# Patient Record
Sex: Male | Born: 1955 | Race: White | Hispanic: No | Marital: Single | Smoking: Current some day smoker
Health system: Southern US, Community
[De-identification: ages and names within clinical notes are randomized; demographics above are authoritative.]

## PROBLEM LIST (undated history)

## (undated) DIAGNOSIS — H9190 Unspecified hearing loss, unspecified ear: Secondary | ICD-10-CM

## (undated) DIAGNOSIS — F40231 Fear of injections and transfusions: Secondary | ICD-10-CM

## (undated) DIAGNOSIS — Z973 Presence of spectacles and contact lenses: Secondary | ICD-10-CM

## (undated) DIAGNOSIS — Z8711 Personal history of peptic ulcer disease: Secondary | ICD-10-CM

## (undated) DIAGNOSIS — T7840XA Allergy, unspecified, initial encounter: Secondary | ICD-10-CM

## (undated) DIAGNOSIS — Z974 Presence of external hearing-aid: Secondary | ICD-10-CM

## (undated) DIAGNOSIS — K4021 Bilateral inguinal hernia, without obstruction or gangrene, recurrent: Secondary | ICD-10-CM

## (undated) DIAGNOSIS — Z8719 Personal history of other diseases of the digestive system: Secondary | ICD-10-CM

---
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
## 2018-11-06 ENCOUNTER — Other Ambulatory Visit: Payer: Self-pay

## 2018-11-06 DIAGNOSIS — Z20822 Contact with and (suspected) exposure to covid-19: Secondary | ICD-10-CM

## 2018-11-07 LAB — NOVEL CORONAVIRUS, NAA: SARS-CoV-2, NAA: NOT DETECTED

## 2019-04-05 ENCOUNTER — Other Ambulatory Visit: Payer: Self-pay

## 2019-04-05 ENCOUNTER — Observation Stay (HOSPITAL_COMMUNITY)
Admission: EM | Admit: 2019-04-05 | Discharge: 2019-04-07 | Disposition: A | Discharge status: Home / Self Care | Payer: BC Managed Care – PPO | Attending: Surgery | Admitting: Surgery

## 2019-04-05 ENCOUNTER — Encounter (HOSPITAL_COMMUNITY): Payer: Self-pay | Admitting: Emergency Medicine

## 2019-04-05 ENCOUNTER — Emergency Department (HOSPITAL_COMMUNITY): Payer: BC Managed Care – PPO

## 2019-04-05 DIAGNOSIS — K4030 Unilateral inguinal hernia, with obstruction, without gangrene, not specified as recurrent: Principal | ICD-10-CM | POA: Insufficient documentation

## 2019-04-05 DIAGNOSIS — Z885 Allergy status to narcotic agent status: Secondary | ICD-10-CM | POA: Insufficient documentation

## 2019-04-05 DIAGNOSIS — Z882 Allergy status to sulfonamides status: Secondary | ICD-10-CM | POA: Diagnosis not present

## 2019-04-05 DIAGNOSIS — Z20822 Contact with and (suspected) exposure to covid-19: Secondary | ICD-10-CM | POA: Insufficient documentation

## 2019-04-05 DIAGNOSIS — K460 Unspecified abdominal hernia with obstruction, without gangrene: Secondary | ICD-10-CM | POA: Diagnosis present

## 2019-04-05 DIAGNOSIS — H9190 Unspecified hearing loss, unspecified ear: Secondary | ICD-10-CM | POA: Insufficient documentation

## 2019-04-05 DIAGNOSIS — K4090 Unilateral inguinal hernia, without obstruction or gangrene, not specified as recurrent: Secondary | ICD-10-CM | POA: Insufficient documentation

## 2019-04-05 DIAGNOSIS — Z880 Allergy status to penicillin: Secondary | ICD-10-CM | POA: Insufficient documentation

## 2019-04-05 LAB — LACTIC ACID, PLASMA
Lactic Acid, Venous: 1.4 mmol/L (ref 0.5–1.9)
Lactic Acid, Venous: 1.3 mmol/L (ref 0.5–1.9)

## 2019-04-05 LAB — BASIC METABOLIC PANEL
Anion gap: 10 (ref 5–15)
BUN: 15 mg/dL (ref 8–23)
CO2: 26 mmol/L (ref 22–32)
Calcium: 9.3 mg/dL (ref 8.9–10.3)
Chloride: 105 mmol/L (ref 98–111)
Creatinine, Ser: 0.74 mg/dL (ref 0.61–1.24)
GFR calc Af Amer: >60 mL/min (ref >60)
GFR calc non Af Amer: >60 mL/min (ref >60)
Glucose, Bld: 129 mg/dL — ABNORMAL HIGH (ref 70–99)
Potassium: 4.2 mmol/L (ref 3.5–5.1)
Sodium: 141 mmol/L (ref 135–145)

## 2019-04-05 LAB — RESPIRATORY PANEL BY RT PCR (FLU A&B, COVID)
Influenza A by PCR: NEGATIVE
Influenza B by PCR: NEGATIVE
SARS Coronavirus 2 by RT PCR: NEGATIVE

## 2019-04-05 LAB — CBC WITH DIFFERENTIAL/PLATELET
Abs Immature Granulocytes: 0.04 10*3/uL (ref 0.00–0.07)
Basophils Absolute: 0 10*3/uL (ref 0.0–0.1)
Basophils Relative: 0 %
Eosinophils Absolute: 0 10*3/uL (ref 0.0–0.5)
Eosinophils Relative: 0 %
HCT: 45.4 % (ref 39.0–52.0)
Hemoglobin: 15.2 g/dL (ref 13.0–17.0)
Immature Granulocytes: 0 %
Lymphocytes Relative: 10 %
Lymphs Abs: 1.1 10*3/uL (ref 0.7–4.0)
MCH: 32.1 pg (ref 26.0–34.0)
MCHC: 33.5 g/dL (ref 30.0–36.0)
MCV: 96 fL (ref 80.0–100.0)
Monocytes Absolute: 0.4 10*3/uL (ref 0.1–1.0)
Monocytes Relative: 3 %
Neutro Abs: 10.2 10*3/uL — ABNORMAL HIGH (ref 1.7–7.7)
Neutrophils Relative %: 87 %
Platelets: 301 10*3/uL (ref 150–400)
RBC: 4.73 MIL/uL (ref 4.22–5.81)
RDW: 12.9 % (ref 11.5–15.5)
WBC: 11.8 10*3/uL — ABNORMAL HIGH (ref 4.0–10.5)
nRBC: 0 % (ref 0.0–0.2)

## 2019-04-05 MED ORDER — IOHEXOL 300 MG/ML  SOLN
100.0000 mL | Freq: Once | INTRAMUSCULAR | Status: AC | PRN
  Administered 2019-04-05: 22:00:00 100 mL via INTRAVENOUS

## 2019-04-05 MED ORDER — HYDROMORPHONE HCL 1 MG/ML IJ SOLN
1.0000 mg | Freq: Once | INTRAMUSCULAR | Status: AC
  Administered 2019-04-05: 22:00:00 1 mg via INTRAVENOUS
  Filled 2019-04-05: qty 1

## 2019-04-05 MED ORDER — DIPHENHYDRAMINE HCL 50 MG/ML IJ SOLN: 12.5000 mg | Freq: Four times a day (QID) | INTRAMUSCULAR | Status: DC | PRN

## 2019-04-05 MED ORDER — ACETAMINOPHEN 500 MG PO TABS
1000.0000 mg | ORAL_TABLET | Freq: Four times a day (QID) | ORAL | Status: DC
  Administered 2019-04-05 – 2019-04-06 (×2): 1000 mg via ORAL
  Filled 2019-04-05 (×2): qty 2

## 2019-04-05 MED ORDER — HYDROMORPHONE HCL 1 MG/ML IJ SOLN: 0.5000 mg | INTRAMUSCULAR | Status: DC | PRN

## 2019-04-05 MED ORDER — IBUPROFEN 600 MG PO TABS: 600.0000 mg | ORAL_TABLET | Freq: Four times a day (QID) | ORAL | Status: DC | PRN

## 2019-04-05 MED ORDER — OXYCODONE-ACETAMINOPHEN 5-325 MG PO TABS
2.0000 | ORAL_TABLET | Freq: Once | ORAL | Status: AC
  Administered 2019-04-05: 2 via ORAL
  Filled 2019-04-05: qty 2

## 2019-04-05 MED ORDER — HEPARIN SODIUM (PORCINE) 5000 UNIT/ML IJ SOLN
5000.0000 [IU] | Freq: Three times a day (TID) | INTRAMUSCULAR | Status: DC
  Filled 2019-04-05 (×2): qty 1

## 2019-04-05 MED ORDER — ONDANSETRON 4 MG PO TBDP: 4.0000 mg | ORAL_TABLET | Freq: Four times a day (QID) | ORAL | Status: DC | PRN

## 2019-04-05 MED ORDER — SIMETHICONE 80 MG PO CHEW: 40.0000 mg | CHEWABLE_TABLET | Freq: Four times a day (QID) | ORAL | Status: DC | PRN

## 2019-04-05 MED ORDER — DIPHENHYDRAMINE HCL 12.5 MG/5ML PO ELIX: 12.5000 mg | ORAL_SOLUTION | Freq: Four times a day (QID) | ORAL | Status: DC | PRN

## 2019-04-05 MED ORDER — HYDROMORPHONE HCL 1 MG/ML IJ SOLN
1.0000 mg | Freq: Once | INTRAMUSCULAR | Status: AC
  Administered 2019-04-05: 23:00:00 1 mg via INTRAVENOUS
  Filled 2019-04-05: qty 1

## 2019-04-05 MED ORDER — LACTATED RINGERS IV SOLN: INTRAVENOUS | Status: DC

## 2019-04-05 MED ORDER — OXYCODONE HCL 5 MG PO TABS: 5.0000 mg | ORAL_TABLET | Freq: Four times a day (QID) | ORAL | Status: DC | PRN

## 2019-04-05 MED ORDER — ONDANSETRON HCL 4 MG/2ML IJ SOLN: 4.0000 mg | Freq: Four times a day (QID) | INTRAMUSCULAR | Status: DC | PRN

## 2019-04-05 MED ORDER — SODIUM CHLORIDE 0.9 % IV BOLUS
1000.0000 mL | Freq: Once | INTRAVENOUS | Status: AC
  Administered 2019-04-05: 23:00:00 1000 mL via INTRAVENOUS

## 2019-04-05 NOTE — ED Triage Notes (Signed)
BIB EMS from home. Pt presents with L inguinal hernia. States it "popped out" PTA. Pt appears to be in discomfort in triage.

## 2019-04-05 NOTE — H&P (Addendum)
CC: Left groin pain  Requesting provider: Dr. Renaye Rakers  HPI: Aum Teer is an 64 y.o. male whom denies any prior medical hx presents to ED with left groin pain and bulge.  Pain is described as sharp and nonradiating.  Nothing seems to make it better or worse.  He denies any associated nausea/vomiting/abdominal pain.  He reports an intermittent bulge in his right groin that has been present for years but has never noticed any bulge in the left groin until today.  He denies any history of abdominal surgery or hernia surgery. He reports that since the hernia came out, he has been unable to walk.  He has a significant fear of needles and it took a couple of hours, hydrocodone, and security guards holding him down (at his request) to obtain IV access.  History reviewed. No pertinent past medical history.  History reviewed. No pertinent surgical history.  No family history on file.  Social:  does not have a smoking history on file. He has never used smokeless tobacco. He reports previous alcohol use. He reports previous drug use.  Allergies:  Allergies  Allergen Reactions  . Codeine   . Penicillins Rash  . Sulfa Antibiotics Rash    Medications: I have reviewed the patient's current medications.  Results for orders placed or performed during the hospital encounter of 04/05/19 (from the past 48 hour(s))  Basic metabolic panel     Status: Abnormal   Collection Time: 04/05/19  7:27 PM  Result Value Ref Range   Sodium 141 135 - 145 mmol/L   Potassium 4.2 3.5 - 5.1 mmol/L   Chloride 105 98 - 111 mmol/L   CO2 26 22 - 32 mmol/L   Glucose, Bld 129 (H) 70 - 99 mg/dL    Comment: Glucose reference range applies only to samples taken after fasting for at least 8 hours.   BUN 15 8 - 23 mg/dL   Creatinine, Ser 9.24 0.61 - 1.24 mg/dL   Calcium 9.3 8.9 - 26.8 mg/dL   GFR calc non Af Amer >60 >60 mL/min   GFR calc Af Amer >60 >60 mL/min   Anion gap 10 5 - 15    Comment: Performed at [facility], [address], [place], [place] [address]  CBC with Differential     Status: Abnormal   Collection Time: 04/05/19  7:27 PM  Result Value Ref Range   WBC 11.8 (H) 4.0 - 10.5 K/uL   RBC 4.73 4.22 - 5.81 MIL/uL   Hemoglobin 15.2 13.0 - 17.0 g/dL   HCT 22.2 97.9 - 89.2 %   MCV 96.0 80.0 - 100.0 fL   MCH 32.1 26.0 - 34.0 pg   MCHC 33.5 30.0 - 36.0 g/dL   RDW 11.9 41.7 - 40.8 %   Platelets 301 150 - 400 K/uL   nRBC 0.0 0.0 - 0.2 %   Neutrophils Relative % 87 %   Neutro Abs 10.2 (H) 1.7 - 7.7 K/uL   Lymphocytes Relative 10 %   Lymphs Abs 1.1 0.7 - 4.0 K/uL   Monocytes Relative 3 %   Monocytes Absolute 0.4 0.1 - 1.0 K/uL   Eosinophils Relative 0 %   Eosinophils Absolute 0.0 0.0 - 0.5 K/uL   Basophils Relative 0 %   Basophils Absolute 0.0 0.0 - 0.1 K/uL   Immature Granulocytes 0 %   Abs Immature Granulocytes 0.04 0.00 - 0.07 K/uL    Comment: Performed at [facility], [address] [place], [place] [address]  Lactic acid, plasma     Status: None   Collection Time: 04/05/19  7:45 PM  Result Value Ref Range   Lactic Acid, Venous 1.4 0.5 - 1.9 mmol/L    Comment: Performed at [facility], [address] [place], [place] [address]  Respiratory Panel by RT PCR (Flu A&B, Covid) - Nasopharyngeal Swab     Status: None   Collection Time: 04/05/19  8:10 PM   Specimen: Nasopharyngeal Swab  Result Value Ref Range   SARS Coronavirus 2 by RT PCR NEGATIVE NEGATIVE    Comment: (NOTE) SARS-CoV-2 target nucleic acids are NOT DETECTED. The SARS-CoV-2 RNA is generally detectable in upper respiratoy specimens during the acute phase of infection. The lowest concentration of SARS-CoV-2 viral copies this assay can detect is 131 copies/mL. A negative result does not preclude SARS-Cov-2 infection and should not be used as the sole basis for treatment or other patient management decisions. A negative result may occur with  improper specimen collection/handling, submission of  specimen other than nasopharyngeal swab, presence of viral mutation(s) within the areas targeted by this assay, and inadequate number of viral copies (<131 copies/mL). A negative result must be combined with clinical observations, patient history, and epidemiological information. The expected result is Negative. Fact Sheet for Patients:  https://www.moore.com/https://www.fda.gov/media/142436/download Fact Sheet for Healthcare Providers:  https://www.young.biz/https://www.fda.gov/media/142435/download This test is not yet ap proved or cleared by the [place] FDA and  has been authorized for detection and/or diagnosis of SARS-CoV-2 by FDA under an Emergency Use Authorization (EUA). This EUA will remain  in effect (meaning this test can be used) for the duration of the COVID-19 declaration under Section 564(b)(1) of the Act, 21 U.S.C. section 360bbb-3(b)(1), unless the authorization is terminated or revoked sooner.    Influenza A by PCR NEGATIVE NEGATIVE   Influenza B by PCR NEGATIVE NEGATIVE    Comment: (NOTE) The Xpert Xpress SARS-CoV-2/FLU/RSV assay is intended as an aid in  the diagnosis of influenza from Nasopharyngeal swab specimens and  should not be used as a sole basis for treatment. Nasal washings and  aspirates are unacceptable for Xpert Xpress SARS-CoV-2/FLU/RSV  testing. Fact Sheet for Patients: https://www.moore.com/https://www.fda.gov/media/142436/download Fact Sheet for Healthcare Providers: https://www.young.biz/https://www.fda.gov/media/142435/download This test is not yet approved or cleared by the [place] FDA and  has been authorized for detection and/or diagnosis of SARS-CoV-2 by  FDA under an Emergency Use Authorization (EUA). This EUA will remain  in effect (meaning this test can be used) for the duration of the  Covid-19 declaration under Section 564(b)(1) of the Act, 21  U.S.C. section 360bbb-3(b)(1), unless the authorization is  terminated or revoked. Performed at [facility], [address] [place], [place] [address]    Lactic acid, plasma     Status: None   Collection Time: 04/05/19  9:27 PM  Result Value Ref Range   Lactic Acid, Venous 1.3 0.5 - 1.9 mmol/L    Comment: Performed at [facility], [address] [place], [place] [address]    CT ABDOMEN PELVIS W CONTRAST  Result Date: 04/05/2019 CLINICAL DATA:  Left inguinal hernia concern for incarceration EXAM: CT ABDOMEN AND PELVIS WITH CONTRAST TECHNIQUE: Multidetector CT imaging of the abdomen and pelvis was performed using the standard protocol following bolus administration of intravenous contrast. CONTRAST:  100mL OMNIPAQUE IOHEXOL 300 MG/ML  SOLN COMPARISON:  None. FINDINGS: Lower chest: Abdomen: Lung bases are clear. Hepatobiliary: No focal liver abnormality is seen. No gallstones,  gallbladder wall thickening, or biliary dilatation. Pancreas: Unremarkable. No pancreatic ductal dilatation or surrounding inflammatory changes. Spleen: Normal in size without focal abnormality. Adrenals/Urinary Tract: Adrenal glands are unremarkable. Kidneys are normal, without renal calculi, focal lesion, or hydronephrosis. Bladder is unremarkable. Stomach/Bowel: There is no evidence of bowel obstruction. There is segmental wall thickening involving the mid jejunum, in the left mid abdomen and left lower quadrant. Findings are consistent with enteritis. Normal gas-filled appendix right lower quadrant. Vascular/Lymphatic: Mild atherosclerosis of the aorta. No pathologic adenopathy. Reproductive: Prostate is mildly enlarged but otherwise unremarkable. Other: Left inguinal hernia is identified. I do not see any bowel herniation. A small amount of fluid is seen within the hernia sac. There is stranding of the herniated fat, compatible with incarceration. There is trace ascites throughout the abdomen, likely reactive. No free intra-abdominal gas. Musculoskeletal: No acute or destructive bony lesions. Reconstructed images demonstrate no additional findings. IMPRESSION: 1. Likely  incarcerated fat containing left inguinal hernia. A small amount of ascites protrudes into the hernia as well. No bowel herniation. 2. Segmental wall thickening of the mid jejunum consistent with inflammatory or infectious enteritis. 3. Trace ascites. Electronically Signed   By: Sharlet Salina M.D.   On: 04/05/2019 22:02    ROS - all of the below systems have been reviewed with the patient and positives are indicated with bold text General: chills, fever or night sweats Eyes: blurry vision or double vision ENT: epistaxis or sore throat Allergy/Immunology: itchy/watery eyes or nasal congestion Hematologic/Lymphatic: bleeding problems, blood clots or swollen lymph nodes Endocrine: temperature intolerance or unexpected weight changes Breast: new or changing breast lumps or nipple discharge Resp: cough, shortness of breath, or wheezing CV: chest pain or dyspnea on exertion GI: as per HPI GU: dysuria, trouble voiding, or hematuria MSK: joint pain or joint stiffness Neuro: TIA or stroke symptoms Derm: pruritus and skin lesion changes Psych: anxiety and depression  PE Blood pressure 130/87, pulse 79, temperature 98.4 F (36.9 C), temperature source Oral, resp. rate 20, height 5\' 8"  (1.727 m), weight 49.9 kg, SpO2 95 %. Constitutional: NAD; conversant; no deformities; wearing mask Eyes: Moist conjunctiva; no lid lag; anicteric; PERRL Neck: Trachea midline; no thyromegaly Lungs: Normal respiratory effort; no tactile fremitus CV: RRR; no palpable thrills; no pitting edema GI: Abd soft, nontender, nondistended; no palpable hepatosplenomegaly; left groin with incarcerated bulge; no overlying skin changes MSK: Normal range of motion of extremities; no clubbing/cyanosis Psychiatric: Appropriate affect; alert and oriented x3 Lymphatic: No palpable cervical or axillary lymphadenopathy  Results for orders placed or performed during the hospital encounter of 04/05/19 (from the past 48 hour(s))  Basic  metabolic panel     Status: Abnormal   Collection Time: 04/05/19  7:27 PM  Result Value Ref Range   Sodium 141 135 - 145 mmol/L   Potassium 4.2 3.5 - 5.1 mmol/L   Chloride 105 98 - 111 mmol/L   CO2 26 22 - 32 mmol/L   Glucose, Bld 129 (H) 70 - 99 mg/dL    Comment: Glucose reference range applies only to samples taken after fasting for at least 8 hours.   BUN 15 8 - 23 mg/dL   Creatinine, Ser 04/07/19 0.61 - 1.24 mg/dL   Calcium 9.3 8.9 - 2.13 mg/dL   GFR calc non Af Amer >60 >60 mL/min   GFR calc Af Amer >60 >60 mL/min   Anion gap 10 5 - 15    Comment: Performed at [facility], [address],  [place], [place] [address]  CBC with Differential     Status: Abnormal   Collection Time: 04/05/19  7:27 PM  Result Value Ref Range   WBC 11.8 (H) 4.0 - 10.5 K/uL   RBC 4.73 4.22 - 5.81 MIL/uL   Hemoglobin 15.2 13.0 - 17.0 g/dL   HCT 01.6 01.0 - 93.2 %   MCV 96.0 80.0 - 100.0 fL   MCH 32.1 26.0 - 34.0 pg   MCHC 33.5 30.0 - 36.0 g/dL   RDW 35.5 73.2 - 20.2 %   Platelets 301 150 - 400 K/uL   nRBC 0.0 0.0 - 0.2 %   Neutrophils Relative % 87 %   Neutro Abs 10.2 (H) 1.7 - 7.7 K/uL   Lymphocytes Relative 10 %   Lymphs Abs 1.1 0.7 - 4.0 K/uL   Monocytes Relative 3 %   Monocytes Absolute 0.4 0.1 - 1.0 K/uL   Eosinophils Relative 0 %   Eosinophils Absolute 0.0 0.0 - 0.5 K/uL   Basophils Relative 0 %   Basophils Absolute 0.0 0.0 - 0.1 K/uL   Immature Granulocytes 0 %   Abs Immature Granulocytes 0.04 0.00 - 0.07 K/uL    Comment: Performed at [facility], [address] [place], [place] [address]  Lactic acid, plasma     Status: None   Collection Time: 04/05/19  7:45 PM  Result Value Ref Range   Lactic Acid, Venous 1.4 0.5 - 1.9 mmol/L    Comment: Performed at [facility], [address], [place], [place] [address]  Respiratory Panel by RT PCR (Flu A&B, Covid) - Nasopharyngeal Swab     Status: None   Collection Time: 04/05/19  8:10 PM   Specimen: Nasopharyngeal Swab   Result Value Ref Range   SARS Coronavirus 2 by RT PCR NEGATIVE NEGATIVE    Comment: (NOTE) SARS-CoV-2 target nucleic acids are NOT DETECTED. The SARS-CoV-2 RNA is generally detectable in upper respiratoy specimens during the acute phase of infection. The lowest concentration of SARS-CoV-2 viral copies this assay can detect is 131 copies/mL. A negative result does not preclude SARS-Cov-2 infection and should not be used as the sole basis for treatment or other patient management decisions. A negative result may occur with  improper specimen collection/handling, submission of specimen other than nasopharyngeal swab, presence of viral mutation(s) within the areas targeted by this assay, and inadequate number of viral copies (<131 copies/mL). A negative result must be combined with clinical observations, patient history, and epidemiological information. The expected result is Negative. Fact Sheet for Patients:  https://www.moore.com/ Fact Sheet for Healthcare Providers:  https://www.young.biz/ This test is not yet ap proved or cleared by the [place] FDA and  has been authorized for detection and/or diagnosis of SARS-CoV-2 by FDA under an Emergency Use Authorization (EUA). This EUA will remain  in effect (meaning this test can be used) for the duration of the COVID-19 declaration under Section 564(b)(1) of the Act, 21 U.S.C. section 360bbb-3(b)(1), unless the authorization is terminated or revoked sooner.    Influenza A by PCR NEGATIVE NEGATIVE   Influenza B by PCR NEGATIVE NEGATIVE    Comment: (NOTE) The Xpert Xpress SARS-CoV-2/FLU/RSV assay is intended as an aid in  the diagnosis of influenza from Nasopharyngeal swab specimens and  should not be used as a sole basis for treatment. Nasal washings and  aspirates are unacceptable for Xpert Xpress SARS-CoV-2/FLU/RSV  testing. Fact Sheet for  Patients: https://www.moore.com/ Fact Sheet for Healthcare Providers: https://www.young.biz/ This test  is not yet approved or cleared by the [place] and  has been authorized for detection and/or diagnosis of SARS-CoV-2 by  FDA under an Emergency Use Authorization (EUA). This EUA will remain  in effect (meaning this test can be used) for the duration of the  Covid-19 declaration under Section 564(b)(1) of the Act, 21  U.S.C. section 360bbb-3(b)(1), unless the authorization is  terminated or revoked. Performed at [facility], [place] [address], [place], [place] [address]   Lactic acid, plasma     Status: None   Collection Time: 04/05/19  9:27 PM  Result Value Ref Range   Lactic Acid, Venous 1.3 0.5 - 1.9 mmol/L    Comment: Performed at [place] [address], [place], [place] [address]    CT ABDOMEN PELVIS W CONTRAST  Result Date: 04/05/2019 CLINICAL DATA:  Left inguinal hernia concern for incarceration EXAM: CT ABDOMEN AND PELVIS WITH CONTRAST TECHNIQUE: Multidetector CT imaging of the abdomen and pelvis was performed using the standard protocol following bolus administration of intravenous contrast. CONTRAST:  127mL OMNIPAQUE IOHEXOL 300 MG/ML  SOLN COMPARISON:  None. FINDINGS: Lower chest: Abdomen: Lung bases are clear. Hepatobiliary: No focal liver abnormality is seen. No gallstones, gallbladder wall thickening, or biliary dilatation. Pancreas: Unremarkable. No pancreatic ductal dilatation or surrounding inflammatory changes. Spleen: Normal in size without focal abnormality. Adrenals/Urinary Tract: Adrenal glands are unremarkable. Kidneys are normal, without renal calculi, focal lesion, or hydronephrosis. Bladder is unremarkable. Stomach/Bowel: There is no evidence of bowel obstruction. There is segmental wall thickening involving the mid jejunum, in the left mid abdomen and left lower quadrant. Findings are consistent with  enteritis. Normal gas-filled appendix right lower quadrant. Vascular/Lymphatic: Mild atherosclerosis of the aorta. No pathologic adenopathy. Reproductive: Prostate is mildly enlarged but otherwise unremarkable. Other: Left inguinal hernia is identified. I do not see any bowel herniation. A small amount of fluid is seen within the hernia sac. There is stranding of the herniated fat, compatible with incarceration. There is trace ascites throughout the abdomen, likely reactive. No free intra-abdominal gas. Musculoskeletal: No acute or destructive bony lesions. Reconstructed images demonstrate no additional findings. IMPRESSION: 1. Likely incarcerated fat containing left inguinal hernia. A small amount of ascites protrudes into the hernia as well. No bowel herniation. 2. Segmental wall thickening of the mid jejunum consistent with inflammatory or infectious enteritis. 3. Trace ascites. Electronically Signed   By: Randa Ngo M.D.   On: 04/05/2019 22:02    A/P: Ava Deguire is an 64 y.o. male with no known medical hx here today with incarcerated fat containing inguinal hernia  -Given his significant symptoms and additionally the degree of difficulty he had to go through to get IV access, will plan to admit to hospital and fix the hernia as OR time allows -CT shows no bowel (which I have personally reviewed) but his pain has interfered with his ability to ambulate -The anatomy and physiology of the GI tract and abdominal wall was discussed at length with the patient with associated illustrations. The pathophysiology of hernias was discussed at length as well -We discussed left inguinal hernia repair with mesh; occasionally repairing alone with stitches if findings dictate this to be necessary -The planned procedure, material risks (including, but not limited to, pain, bleeding, infection, scarring, need for blood transfusion, damage to surrounding structures-blood vessels/nerves/viscus/organs, need for  additional procedures, recurrence, chronic pain, mesh complications including erosion into other structures/vessels/organs/viscus, worsening of pre-existing medical conditions, pneumonia, heart attack, stroke, death)  benefits and alternatives to surgery were discussed at length. I noted a good probability that the procedure help improve his symptoms. The patient's questions were answered to his satisfaction, he voiced understanding and they elected to proceed with surgery. Additionally, we discussed typical postoperative expectations and the recovery process.  Stephanie Coup. Cliffton Asters, M.D. [facility] Use [email or web address] to contact on call provider

## 2019-04-05 NOTE — ED Provider Notes (Signed)
[facility] EMERGENCY DEPARTMENT Provider Note   CSN: [id number] Arrival date & time: 04/05/19  1810     History Chief Complaint  Patient presents with  . Hernia    Clifford Green is a 64 y.o. male presenting with left inguinal pain.  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  Left one popped out while at work today around 1 pm.  Hasn't been able ot reduce.  Severe pain.  Nausea, self-induced vomiting.  Not sure if passing gas.    No fevers. NKDA  HPI     History reviewed. No pertinent past medical history.  Patient Active Problem List   Diagnosis Date Noted  . Incarcerated hernia 04/05/2019    History reviewed. No pertinent surgical history.     No family history on file.  Social History   Tobacco Use  . Smoking status: Not on file  . Smokeless tobacco: Never Used  Substance Use Topics  . Alcohol use: Not Currently  . Drug use: Not Currently    Home Medications Prior to Admission medications   Medication Sig Start Date End Date Taking? Authorizing Provider  cetirizine (ZYRTEC) 10 MG tablet Take 10 mg by mouth at bedtime.   Yes [provider]  omeprazole (PRILOSEC) 20 MG capsule Take 20 mg by mouth every evening.   Yes [provider]    Allergies    Codeine, Penicillins, and Sulfa antibiotics  Review of Systems   Review of Systems  Constitutional: Negative for chills and fever.  Respiratory: Negative for cough and shortness of breath.   Cardiovascular: Negative for chest pain and palpitations.  Gastrointestinal: Positive for nausea and vomiting. Negative for abdominal pain.  All other systems reviewed and are negative.   Physical Exam Updated Vital Signs BP 125/79 (BP Location: Left Arm)   Pulse 66   Temp 98.3 F (36.8 C) (Oral)   Resp 20   Ht 5\' 8"  (1.727 m)   Wt 49.1 kg   SpO2 98%   BMI 16.46 kg/m   Physical Exam Vitals and nursing note reviewed.  Constitutional:      General: He is in acute  distress.     Appearance: He is well-developed.     Comments: Thin habitus  HENT:     Head: Normocephalic and atraumatic.  Eyes:     Conjunctiva/sclera: Conjunctivae normal.  Cardiovascular:     Rate and Rhythm: Normal rate and regular rhythm.     Pulses: Normal pulses.  Pulmonary:     Effort: Pulmonary effort is normal. No respiratory distress.  Abdominal:     Palpations: Abdomen is soft.     Tenderness: There is no abdominal tenderness.  Genitourinary:    Comments: Bilateral indirect inguinal hernias, left > right, right hernia soft and reducible, left hernia firm and tender to touch, unable to initially reduce, some mild overlying erythema without crepitus or skin induration No involvement of the scrotum or testes, no palpable direct hernia, no tenderness of the testicles or epididymis, +cremastric reflux Normal circumsized penis, no lesions or drainage Musculoskeletal:     Cervical back: Neck supple.  Skin:    General: Skin is warm and dry.  Neurological:     Mental Status: He is alert.     ED Results / Procedures / Treatments   Labs (all labs ordered are listed, but only abnormal results are displayed) Labs Reviewed  BASIC METABOLIC PANEL - Abnormal; Notable for the following components:      Result Value  Glucose, Bld 129 (*)    All other components within normal limits  CBC WITH DIFFERENTIAL/PLATELET - Abnormal; Notable for the following components:   WBC 11.8 (*)    Neutro Abs 10.2 (*)    All other components within normal limits  RESPIRATORY PANEL BY RT PCR (FLU A&B, COVID)  SURGICAL PCR SCREEN  LACTIC ACID, PLASMA  LACTIC ACID, PLASMA  HIV ANTIBODY (ROUTINE TESTING W REFLEX)    EKG None  Radiology CT ABDOMEN PELVIS W CONTRAST  Result Date: 04/05/2019 CLINICAL DATA:  Left inguinal hernia concern for incarceration EXAM: CT ABDOMEN AND PELVIS WITH CONTRAST TECHNIQUE: Multidetector CT imaging of the abdomen and pelvis was performed using the standard  protocol following bolus administration of intravenous contrast. CONTRAST:  OMNIPAQUE IOHEXOL 300 MG/ML  SOLN COMPARISON:  None. FINDINGS: Lower chest: Abdomen: Lung bases are clear. Hepatobiliary: No focal liver abnormality is seen. No gallstones, gallbladder wall thickening, or biliary dilatation. Pancreas: Unremarkable. No pancreatic ductal dilatation or surrounding inflammatory changes. Spleen: Normal in size without focal abnormality. Adrenals/Urinary Tract: Adrenal glands are unremarkable. Kidneys are normal, without renal calculi, focal lesion, or hydronephrosis. Bladder is unremarkable. Stomach/Bowel: There is no evidence of bowel obstruction. There is segmental wall thickening involving the mid jejunum, in the left mid abdomen and left lower quadrant. Findings are consistent with enteritis. Normal gas-filled appendix right lower quadrant. Vascular/Lymphatic: Mild atherosclerosis of the aorta. No pathologic adenopathy. Reproductive: Prostate is mildly enlarged but otherwise unremarkable. Other: Left inguinal hernia is identified. I do not see any bowel herniation. A small amount of fluid is seen within the hernia sac. There is stranding of the herniated fat, compatible with incarceration. There is trace ascites throughout the abdomen, likely reactive. No free intra-abdominal gas. Musculoskeletal: No acute or destructive bony lesions. Reconstructed images demonstrate no additional findings. IMPRESSION: 1. Likely incarcerated fat containing left inguinal hernia. A small amount of ascites protrudes into the hernia as well. No bowel herniation. 2. Segmental wall thickening of the mid jejunum consistent with inflammatory or infectious enteritis. 3. Trace ascites. Electronically Signed   By: Sharlet Salina M.D.   On: 04/05/2019 22:02    Procedures Procedures (including critical care time)  Medications Ordered in ED Medications  heparin injection 5,000 Units (5,000 Units Subcutaneous Refused 04/05/19  2323)  lactated ringers infusion ( Intravenous New Bag/Given 04/06/19 0011)  diphenhydrAMINE (BENADRYL) 12.5 MG/5ML elixir 12.5 mg (has no administration in time range)    Or  diphenhydrAMINE (BENADRYL) injection 12.5 mg (has no administration in time range)  ondansetron (ZOFRAN-ODT) disintegrating tablet 4 mg (has no administration in time range)    Or  ondansetron (ZOFRAN) injection 4 mg (has no administration in time range)  simethicone (MYLICON) chewable tablet 40 mg (has no administration in time range)  acetaminophen (TYLENOL) tablet 1,000 mg (1,000 mg Oral Given 04/05/19 2323)  ibuprofen (ADVIL) tablet 600 mg (has no administration in time range)  oxyCODONE (Oxy IR/ROXICODONE) immediate release tablet 5 mg (has no administration in time range)  HYDROmorphone (DILAUDID) injection 0.5 mg (has no administration in time range)  oxyCODONE-acetaminophen (PERCOCET/ROXICET) 5-325 MG per tablet 2 tablet (2 tablets Oral Given 04/05/19 1915)  HYDROmorphone (DILAUDID) injection 1 mg (1 mg Intravenous Given 04/05/19 2153)  iohexol (OMNIPAQUE) 300 MG/ML solution 100 mL (100 mLs Intravenous Contrast Given 04/05/19 2149)  HYDROmorphone (DILAUDID) injection 1 mg (1 mg Intravenous Given 04/05/19 2323)  sodium chloride 0.9 % bolus 1,000 mL (1,000 mLs Intravenous Bolus from Bag 04/05/19 2324)    ED  Course  I have reviewed the triage vital signs and the nursing notes.  Pertinent labs & imaging results that were available during my care of the patient were reviewed by me and considered in my medical decision making (see chart for details).  64 yo male presenting with left sided indirect inguinal hernia, bulged out around 1 pm, here with nausea and vomiting as well.  Initial exam was concerning for incarceration vs. Strangulation.  It was difficult to obtain IV access to get his pain under control, as noted in the ED course below.  However, with IV pain medications, he was able to tolerate his CT scan and  subsequent attempt by myself for bedside reduction.  In trendelenburg, I was able to partially reduce the left hernia, but cannot get it all back in.  He continues having pain, particularly when standing up.  There was no strangulation on CT imaging and his lactate was wnl.  We opted for admission to the general surgery service for pain control.  He reports no other medical conditions and does not take any medications at baseline, "only vitamins."  He has allergies to penicillins and sulfa.  Clinical Course as of Apr 05 144  Thu Apr 05, 2019  1906 Attempted bedside reduction, unable to do so.  Largely limited by patient pain, but hernia remains tense, firm, tender.  Patient refusing IV for pain medicine or labs, stating "I have needles, I get violent when you come near me with a needle, don't fucking do it."  I explained my concern for incarceration as a time-sensitive issue, and a need for rapid reduction.  He still refuses.  We'll try PO percocet and ice packs, and I'll reach out to general surgery as it has already been 6 hours since the hernia came out.   [MT]  1926 I spoke to the surgeon who states we need bloodwork and CT scan.  I tried again to speak to the patient, he is willing now to attempt an IV.  I advised his nurse to bring extra staffing to help restrain the patient while placing the IV, and she said she would do so     [MT]  2132 Lactic Acid, Venous: 1.4 [MT]  2132 Normal lactate reassuring in terms of tissue ischemia, awaiting on CT scan, contacted CT tech to ask for expedited scan   [MT]  2212 IMPRESSION: 1. Likely incarcerated fat containing left inguinal hernia. A small amount of ascites protrudes into the hernia as well. No bowel herniation. 2. Segmental wall thickening of the mid jejunum consistent with inflammatory or infectious enteritis. 3. Trace ascites.   [MT]  2300 Pain improved and I'm able to partially but not fully reduce his inguinal hernia.  It's softer now,  but still tender, and he's still nauseated.  I doubt strangulation, but am concerned this may remain incarcerated, patient admitted by Dr Thayer Ohm white to general surgery service.   [MT]    Clinical Course User Index [MT] Xzavier Swinger, Kermit Balo, MD   Final Clinical Impression(s) / ED Diagnoses Final diagnoses:  Left inguinal hernia    Rx / DC Orders ED Discharge Orders    None       Terald Sleeper, MD 04/06/19 [phone]

## 2019-04-05 NOTE — ED Notes (Signed)
Patient transported to CT 

## 2019-04-06 ENCOUNTER — Observation Stay (HOSPITAL_COMMUNITY): Payer: BC Managed Care – PPO | Admitting: Anesthesiology

## 2019-04-06 ENCOUNTER — Encounter (HOSPITAL_COMMUNITY): Payer: Self-pay

## 2019-04-06 ENCOUNTER — Encounter (HOSPITAL_COMMUNITY): Admission: EM | Disposition: A | Discharge status: Home / Self Care | Payer: Self-pay | Source: Home / Self Care

## 2019-04-06 HISTORY — PX: INGUINAL HERNIA REPAIR: SHX194

## 2019-04-06 LAB — SURGICAL PCR SCREEN
MRSA, PCR: NEGATIVE
Staphylococcus aureus: POSITIVE — AB

## 2019-04-06 LAB — HIV ANTIBODY (ROUTINE TESTING W REFLEX): HIV Screen 4th Generation wRfx: NONREACTIVE

## 2019-04-06 SURGERY — REPAIR, HERNIA, INGUINAL, INCARCERATED
Anesthesia: General | Site: Groin | Laterality: Left

## 2019-04-06 MED ORDER — DIPHENHYDRAMINE HCL 50 MG/ML IJ SOLN: 12.5000 mg | Freq: Four times a day (QID) | INTRAMUSCULAR | Status: DC | PRN

## 2019-04-06 MED ORDER — ENOXAPARIN SODIUM 40 MG/0.4ML ~~LOC~~ SOLN: 40.0000 mg | SUBCUTANEOUS | Status: DC

## 2019-04-06 MED ORDER — MORPHINE SULFATE (PF) 2 MG/ML IV SOLN: 1.0000 mg | INTRAVENOUS | Status: DC | PRN

## 2019-04-06 MED ORDER — DEXAMETHASONE SODIUM PHOSPHATE 10 MG/ML IJ SOLN
INTRAMUSCULAR | Status: DC | PRN
  Administered 2019-04-06: 4 mg via INTRAVENOUS

## 2019-04-06 MED ORDER — MIDAZOLAM HCL 2 MG/2ML IJ SOLN
INTRAMUSCULAR | Status: AC
  Filled 2019-04-06: qty 2

## 2019-04-06 MED ORDER — DIPHENHYDRAMINE HCL 12.5 MG/5ML PO ELIX: 12.5000 mg | ORAL_SOLUTION | Freq: Four times a day (QID) | ORAL | Status: DC | PRN

## 2019-04-06 MED ORDER — OXYCODONE HCL 5 MG PO TABS
5.0000 mg | ORAL_TABLET | ORAL | Status: DC | PRN
  Administered 2019-04-07: 10 mg via ORAL
  Filled 2019-04-06: qty 2

## 2019-04-06 MED ORDER — DEXAMETHASONE SODIUM PHOSPHATE 10 MG/ML IJ SOLN
INTRAMUSCULAR | Status: AC
  Filled 2019-04-06: qty 1

## 2019-04-06 MED ORDER — SUCCINYLCHOLINE CHLORIDE 200 MG/10ML IV SOSY
PREFILLED_SYRINGE | INTRAVENOUS | Status: AC
  Filled 2019-04-06: qty 10

## 2019-04-06 MED ORDER — BUPIVACAINE HCL (PF) 0.25 % IJ SOLN
INTRAMUSCULAR | Status: AC
  Filled 2019-04-06: qty 30

## 2019-04-06 MED ORDER — MIDAZOLAM HCL 5 MG/5ML IJ SOLN
INTRAMUSCULAR | Status: DC | PRN
  Administered 2019-04-06: 2 mg via INTRAVENOUS

## 2019-04-06 MED ORDER — ONDANSETRON HCL 4 MG/2ML IJ SOLN
INTRAMUSCULAR | Status: DC | PRN
  Administered 2019-04-06: 4 mg via INTRAVENOUS

## 2019-04-06 MED ORDER — VANCOMYCIN HCL IN DEXTROSE 1-5 GM/200ML-% IV SOLN
1000.0000 mg | INTRAVENOUS | Status: DC
  Filled 2019-04-06: qty 200

## 2019-04-06 MED ORDER — PROPOFOL 10 MG/ML IV BOLUS
INTRAVENOUS | Status: AC
  Filled 2019-04-06: qty 20

## 2019-04-06 MED ORDER — ENSURE ENLIVE PO LIQD: 237.0000 mL | Freq: Three times a day (TID) | ORAL | Status: DC

## 2019-04-06 MED ORDER — FENTANYL CITRATE (PF) 250 MCG/5ML IJ SOLN
INTRAMUSCULAR | Status: AC
  Filled 2019-04-06: qty 5

## 2019-04-06 MED ORDER — 0.9 % SODIUM CHLORIDE (POUR BTL) OPTIME
TOPICAL | Status: DC | PRN
  Administered 2019-04-06: 1000 mL

## 2019-04-06 MED ORDER — SODIUM CHLORIDE 0.9 % IV SOLN
INTRAVENOUS | Status: AC
  Filled 2019-04-06: qty 500000

## 2019-04-06 MED ORDER — SODIUM CHLORIDE (PF) 0.9 % IJ SOLN
INTRAMUSCULAR | Status: AC
  Filled 2019-04-06: qty 10

## 2019-04-06 MED ORDER — MUPIROCIN 2 % EX OINT
TOPICAL_OINTMENT | CUTANEOUS | Status: AC
  Administered 2019-04-06: 1 via TOPICAL
  Filled 2019-04-06: qty 22

## 2019-04-06 MED ORDER — ONDANSETRON HCL 4 MG/2ML IJ SOLN: 4.0000 mg | Freq: Four times a day (QID) | INTRAMUSCULAR | Status: DC | PRN

## 2019-04-06 MED ORDER — LACTATED RINGERS IV SOLN: INTRAVENOUS | Status: DC

## 2019-04-06 MED ORDER — EPHEDRINE 5 MG/ML INJ
INTRAVENOUS | Status: AC
  Filled 2019-04-06: qty 10

## 2019-04-06 MED ORDER — DOCUSATE SODIUM 100 MG PO CAPS: 100.0000 mg | ORAL_CAPSULE | Freq: Two times a day (BID) | ORAL | 0 refills | Status: DC

## 2019-04-06 MED ORDER — ADULT MULTIVITAMIN W/MINERALS CH: 1.0000 | ORAL_TABLET | Freq: Every day | ORAL | Status: DC

## 2019-04-06 MED ORDER — ONDANSETRON 4 MG PO TBDP: 4.0000 mg | ORAL_TABLET | Freq: Four times a day (QID) | ORAL | Status: DC | PRN

## 2019-04-06 MED ORDER — PHENYLEPHRINE HCL-NACL 10-0.9 MG/250ML-% IV SOLN
INTRAVENOUS | Status: DC | PRN
  Administered 2019-04-06: 25 ug/min via INTRAVENOUS

## 2019-04-06 MED ORDER — FENTANYL CITRATE (PF) 250 MCG/5ML IJ SOLN
INTRAMUSCULAR | Status: DC | PRN
  Administered 2019-04-06: 100 ug via INTRAVENOUS

## 2019-04-06 MED ORDER — FENTANYL CITRATE (PF) 100 MCG/2ML IJ SOLN: 25.0000 ug | INTRAMUSCULAR | Status: DC | PRN

## 2019-04-06 MED ORDER — EPHEDRINE SULFATE-NACL 50-0.9 MG/10ML-% IV SOSY
PREFILLED_SYRINGE | INTRAVENOUS | Status: DC | PRN
  Administered 2019-04-06: 10 mg via INTRAVENOUS

## 2019-04-06 MED ORDER — HYDROMORPHONE HCL 1 MG/ML IJ SOLN
INTRAMUSCULAR | Status: AC
  Filled 2019-04-06: qty 0.5

## 2019-04-06 MED ORDER — OXYCODONE HCL 5 MG PO TABS: 5.0000 mg | ORAL_TABLET | Freq: Four times a day (QID) | ORAL | 0 refills | Status: DC | PRN

## 2019-04-06 MED ORDER — GABAPENTIN 300 MG PO CAPS
300.0000 mg | ORAL_CAPSULE | Freq: Two times a day (BID) | ORAL | Status: DC
  Administered 2019-04-06 (×2): 300 mg via ORAL
  Filled 2019-04-06 (×2): qty 1

## 2019-04-06 MED ORDER — PROPOFOL 10 MG/ML IV BOLUS
INTRAVENOUS | Status: DC | PRN
  Administered 2019-04-06: 160 mg via INTRAVENOUS

## 2019-04-06 MED ORDER — LIDOCAINE 2% (20 MG/ML) 5 ML SYRINGE
INTRAMUSCULAR | Status: AC
  Filled 2019-04-06: qty 5

## 2019-04-06 MED ORDER — SUGAMMADEX SODIUM 200 MG/2ML IV SOLN
INTRAVENOUS | Status: DC | PRN
  Administered 2019-04-06: 200 mg via INTRAVENOUS

## 2019-04-06 MED ORDER — SODIUM CHLORIDE 0.9 % IV SOLN
INTRAVENOUS | Status: DC | PRN
  Administered 2019-04-06: 500 mL

## 2019-04-06 MED ORDER — ROCURONIUM BROMIDE 10 MG/ML (PF) SYRINGE
PREFILLED_SYRINGE | INTRAVENOUS | Status: AC
  Filled 2019-04-06: qty 10

## 2019-04-06 MED ORDER — PHENYLEPHRINE 40 MCG/ML (10ML) SYRINGE FOR IV PUSH (FOR BLOOD PRESSURE SUPPORT)
PREFILLED_SYRINGE | INTRAVENOUS | Status: AC
  Filled 2019-04-06: qty 10

## 2019-04-06 MED ORDER — MUPIROCIN 2 % EX OINT: 1.0000 "application " | TOPICAL_OINTMENT | Freq: Two times a day (BID) | CUTANEOUS | Status: DC

## 2019-04-06 MED ORDER — BUPIVACAINE HCL (PF) 0.25 % IJ SOLN
INTRAMUSCULAR | Status: DC | PRN
  Administered 2019-04-06: 12 mL

## 2019-04-06 MED ORDER — ALBUMIN HUMAN 5 % IV SOLN: INTRAVENOUS | Status: DC | PRN

## 2019-04-06 MED ORDER — SIMETHICONE 80 MG PO CHEW: 40.0000 mg | CHEWABLE_TABLET | Freq: Four times a day (QID) | ORAL | Status: DC | PRN

## 2019-04-06 MED ORDER — ROCURONIUM BROMIDE 10 MG/ML (PF) SYRINGE
PREFILLED_SYRINGE | INTRAVENOUS | Status: DC | PRN
  Administered 2019-04-06: 50 mg via INTRAVENOUS

## 2019-04-06 MED ORDER — PHENYLEPHRINE 40 MCG/ML (10ML) SYRINGE FOR IV PUSH (FOR BLOOD PRESSURE SUPPORT)
PREFILLED_SYRINGE | INTRAVENOUS | Status: DC | PRN
  Administered 2019-04-06: 120 ug via INTRAVENOUS
  Administered 2019-04-06 (×2): 80 ug via INTRAVENOUS
  Administered 2019-04-06: 120 ug via INTRAVENOUS

## 2019-04-06 MED ORDER — LACTATED RINGERS IV SOLN: INTRAVENOUS | Status: DC | PRN

## 2019-04-06 MED ORDER — ACETAMINOPHEN 500 MG PO TABS: 1000.0000 mg | ORAL_TABLET | Freq: Three times a day (TID) | ORAL | 0 refills | Status: AC

## 2019-04-06 MED ORDER — ONDANSETRON HCL 4 MG/2ML IJ SOLN: 4.0000 mg | Freq: Once | INTRAMUSCULAR | Status: DC | PRN

## 2019-04-06 MED ORDER — ACETAMINOPHEN 500 MG PO TABS
1000.0000 mg | ORAL_TABLET | Freq: Four times a day (QID) | ORAL | Status: DC
  Administered 2019-04-06 (×2): 1000 mg via ORAL
  Filled 2019-04-06 (×4): qty 2

## 2019-04-06 MED ORDER — DOCUSATE SODIUM 100 MG PO CAPS
100.0000 mg | ORAL_CAPSULE | Freq: Two times a day (BID) | ORAL | Status: DC
  Administered 2019-04-06: 21:00:00 100 mg via ORAL
  Filled 2019-04-06: qty 1

## 2019-04-06 MED ORDER — ONDANSETRON HCL 4 MG/2ML IJ SOLN
INTRAMUSCULAR | Status: AC
  Filled 2019-04-06: qty 2

## 2019-04-06 MED ORDER — LIDOCAINE 2% (20 MG/ML) 5 ML SYRINGE
INTRAMUSCULAR | Status: DC | PRN
  Administered 2019-04-06: 40 mg via INTRAVENOUS

## 2019-04-06 MED FILL — DOK 100 MG CAPS: 100 | 5 days supply | Qty: 10 | Fill #0

## 2019-04-06 MED FILL — oxyCODONE HCL 5 MG TABS: 5 | 4 days supply | Qty: 15 | Fill #0

## 2019-04-06 MED FILL — ACETAMINOPHEN 500MG XT STRE: 500 | 5 days supply | Qty: 30 | Fill #0

## 2019-04-06 SURGICAL SUPPLY — 42 items
BLADE CLIPPER SURG (BLADE) IMPLANT
CANISTER SUCT 3000ML PPV (MISCELLANEOUS) ×3 IMPLANT
COVER SURGICAL LIGHT HANDLE (MISCELLANEOUS) ×3 IMPLANT
COVER WAND RF STERILE (DRAPES) IMPLANT
DERMABOND ADVANCED (GAUZE/BANDAGES/DRESSINGS) ×2
DERMABOND ADVANCED .7 DNX12 (GAUZE/BANDAGES/DRESSINGS) ×1 IMPLANT
DRAIN PENROSE 1/2X12 LTX STRL (WOUND CARE) ×3 IMPLANT
DRAPE LAPAROSCOPIC ABDOMINAL (DRAPES) ×3 IMPLANT
ELECT REM PT RETURN 9FT ADLT (ELECTROSURGICAL) ×3
ELECTRODE REM PT RTRN 9FT ADLT (ELECTROSURGICAL) ×1 IMPLANT
GAUZE 4X4 16PLY RFD (DISPOSABLE) ×3 IMPLANT
GAUZE SPONGE 4X4 12PLY STRL (GAUZE/BANDAGES/DRESSINGS) IMPLANT
GLOVE BIOGEL M STRL SZ7.5 (GLOVE) ×3 IMPLANT
GLOVE BIOGEL PI IND STRL 8 (GLOVE) ×1 IMPLANT
GLOVE BIOGEL PI INDICATOR 8 (GLOVE) ×2
GLOVE INDICATOR 8.0 STRL GRN (GLOVE) ×3 IMPLANT
GOWN STRL REUS W/ TWL LRG LVL3 (GOWN DISPOSABLE) ×4 IMPLANT
GOWN STRL REUS W/TWL LRG LVL3 (GOWN DISPOSABLE) ×8
KIT BASIN OR (CUSTOM PROCEDURE TRAY) ×3 IMPLANT
KIT TURNOVER KIT B (KITS) ×3 IMPLANT
MESH ULTRAPRO 3X6 7.6X15CM (Mesh General) ×3 IMPLANT
NEEDLE HYPO 25GX1X1/2 BEV (NEEDLE) ×3 IMPLANT
NS IRRIG 1000ML POUR BTL (IV SOLUTION) ×3 IMPLANT
PACK GENERAL/GYN (CUSTOM PROCEDURE TRAY) ×3 IMPLANT
PAD ARMBOARD 7.5X6 YLW CONV (MISCELLANEOUS) ×6 IMPLANT
PENCIL SMOKE EVACUATOR (MISCELLANEOUS) ×3 IMPLANT
STAPLER VISISTAT 35W (STAPLE) IMPLANT
SUT MNCRL AB 4-0 PS2 18 (SUTURE) ×3 IMPLANT
SUT NOVA NAB GS-21 0 18 T12 DT (SUTURE) IMPLANT
SUT NOVA NAB GS-21 1 T12 (SUTURE) IMPLANT
SUT PROLENE 2 0 CT 1 (SUTURE) IMPLANT
SUT PROLENE 2 0 CT2 30 (SUTURE) ×9 IMPLANT
SUT VIC AB 2-0 SH 27 (SUTURE) ×2
SUT VIC AB 2-0 SH 27X BRD (SUTURE) IMPLANT
SUT VIC AB 2-0 SH 27XBRD (SUTURE) ×1 IMPLANT
SUT VIC AB 3-0 SH 18 (SUTURE) ×3 IMPLANT
SUT VIC AB 3-0 SH 27 (SUTURE) ×2
SUT VIC AB 3-0 SH 27XBRD (SUTURE) ×1 IMPLANT
SYR CONTROL 10ML LL (SYRINGE) ×3 IMPLANT
TOWEL GREEN STERILE (TOWEL DISPOSABLE) ×3 IMPLANT
TOWEL GREEN STERILE FF (TOWEL DISPOSABLE) ×3 IMPLANT
TRAY FOLEY MTR SLVR 14FR STAT (SET/KITS/TRAYS/PACK) IMPLANT

## 2019-04-06 NOTE — Discharge Instructions (Signed)
[facility]  UMBILICAL OR INGUINAL HERNIA REPAIR: POST OP INSTRUCTIONS  Always review your discharge instruction sheet given to you by the facility where your surgery was performed. IF YOU HAVE DISABILITY OR FAMILY LEAVE FORMS, YOU MUST BRING THEM TO THE OFFICE FOR PROCESSING.   DO NOT GIVE THEM TO YOUR DOCTOR.  1. A  prescription for pain medication may be given to you upon discharge.  Take your pain medication as prescribed, if needed.  If narcotic pain medicine is not needed, then you may take acetaminophen (Tylenol) or ibuprofen (Advil) as needed. 2. Take your usually prescribed medications unless otherwise directed. 3. If you need a refill on your pain medication, please contact your pharmacy.  They will contact our office to request authorization. Prescriptions will not be filled after 5 pm or on week-ends. 4. You should follow a light diet the first 24 hours after arrival home, such as soup and crackers, etc.  Be sure to include lots of fluids daily.  Resume your normal diet the day after surgery. 5. Most patients will experience some swelling and bruising around the umbilicus or in the groin and scrotum.  Ice packs and reclining will help.  Swelling and bruising can take several days to resolve.  6. It is common to experience some constipation if taking pain medication after surgery.  Increasing fluid intake and taking a stool softener (such as Colace) will usually help or prevent this problem from occurring.  A mild laxative (Milk of Magnesia or Miralax) should be taken according to package directions if there are no bowel movements after 48 hours. 7. Unless discharge instructions indicate otherwise, you may remove your bandages 24-48 hours after surgery, and you may shower at that time.  You may have steri-strips (small skin tapes) in place directly over the incision.  These strips should be left on the skin for 7-10 days.  If your surgeon used skin glue on the incision,  you may shower in 24 hours.  The glue will flake off over the next 2-3 weeks.  Any sutures or staples will be removed at the office during your follow-up visit. 8. ACTIVITIES:  You may resume regular (light) daily activities beginning the next day--such as daily self-care, walking, climbing stairs--gradually increasing activities as tolerated.  You may have sexual intercourse when it is comfortable.  Refrain from any heavy lifting or straining until approved by your doctor. a. You may drive when you are no longer taking prescription pain medication, you can comfortably wear a seatbelt, and you can safely maneuver your car and apply brakes. b. RETURN TO WORK:  9. You should see your doctor in the office for a follow-up appointment approximately 2-3 weeks after your surgery.  Make sure that you call for this appointment within a day or two after you arrive home to insure a convenient appointment time. 10. OTHER INSTRUCTIONS: DO NOT LIFT/PUSH/PULL ANYTHING GREATER THAN 10LBS FOR 1 MONTH    WHEN TO CALL YOUR DOCTOR: 1. Fever over 101.0 2. Inability to urinate 3. Nausea and/or vomiting 4. Extreme swelling or bruising 5. Continued bleeding from incision. 6. Increased pain, redness, or drainage from the incision  The clinic staff is available to answer your questions during regular business hours.  Please don't hesitate to call and ask to speak to one of the nurses for clinical concerns.  If you have a medical emergency, go to the nearest emergency room or call 911.  A surgeon from [facility]  is always on call at the hospital   [address], [place], [place], [place]  [address] ?  [address], [place], [place]   [address] [phone] ? [phone] ? FAX [phone] Web site: [email or web address]  ........Marland Kitchen   Managing Your Pain After Surgery Without Opioids    Thank you for participating in our program to help patients manage their pain after surgery  without opioids. This is part of our effort to provide you with the best care possible, without exposing you or your family to the risk that opioids pose.  What pain can I expect after surgery? You can expect to have some pain after surgery. This is normal. The pain is typically worse the day after surgery, and quickly begins to get better. Many studies have found that many patients are able to manage their pain after surgery with Over-the-Counter (OTC) medications such as Tylenol and Motrin. If you have a condition that does not allow you to take Tylenol or Motrin, notify your surgical team.  How will I manage my pain? The best strategy for controlling your pain after surgery is around the clock pain control with Tylenol (acetaminophen) and Motrin (ibuprofen or Advil). Alternating these medications with each other allows you to maximize your pain control. In addition to Tylenol and Motrin, you can use heating pads or ice packs on your incisions to help reduce your pain.  How will I alternate your regular strength over-the-counter pain medication? You will take a dose of pain medication every three hours. ; Start by taking 650 mg of Tylenol (2 pills of 325 mg) ; 3 hours later take 600 mg of Motrin (3 pills of 200 mg) ; 3 hours after taking the Motrin take 650 mg of Tylenol ; 3 hours after that take 600 mg of Motrin.   - 1 -  See example - if your first dose of Tylenol is at 12:00 PM   12:00 PM Tylenol 650 mg (2 pills of 325 mg)  3:00 PM Motrin 600 mg (3 pills of 200 mg)  6:00 PM Tylenol 650 mg (2 pills of 325 mg)  9:00 PM Motrin 600 mg (3 pills of 200 mg)  Continue alternating every 3 hours   We recommend that you follow this schedule around-the-clock for at least 3 days after surgery, or until you feel that it is no longer needed. Use the table on the last page of this handout to keep track of the medications you are taking. Important: Do not take more than 3000mg  of Tylenol or 1800mg   of Motrin in a 24-hour period. Do not take ibuprofen/Motrin if you have a history of bleeding stomach ulcers, severe kidney disease, &/or actively taking a blood thinner  What if I still have pain? If you have pain that is not controlled with the over-the-counter pain medications (Tylenol and Motrin or Advil) you might have what we call "breakthrough" pain. You will receive a prescription for a small amount of an opioid pain medication such as Oxycodone, Tramadol, or Tylenol with Codeine. Use these opioid pills in the first 24 hours after surgery if you have breakthrough pain. Do not take more than 1 pill every 4-6 hours.  If you still have uncontrolled pain after using all opioid pills, don't hesitate to call our staff using the number provided. We will help make sure you are managing your pain in the best way possible, and if necessary, we can provide a prescription for additional pain medication.  Day 1    Time  Name of Medication Number of pills taken  Amount of Acetaminophen  Pain Level   Comments  AM PM       AM PM       AM PM       AM PM       AM PM       AM PM       AM PM       AM PM       Total Daily amount of Acetaminophen Do not take more than  3,000 mg per day      Day 2    Time  Name of Medication Number of pills taken  Amount of Acetaminophen  Pain Level   Comments  AM PM       AM PM       AM PM       AM PM       AM PM       AM PM       AM PM       AM PM       Total Daily amount of Acetaminophen Do not take more than  3,000 mg per day      Day 3    Time  Name of Medication Number of pills taken  Amount of Acetaminophen  Pain Level   Comments  AM PM       AM PM       AM PM       AM PM          AM PM       AM PM       AM PM       AM PM       Total Daily amount of Acetaminophen Do not take more than  3,000 mg per day      Day 4    Time  Name of Medication Number of pills taken  Amount of Acetaminophen  Pain Level   Comments   AM PM       AM PM       AM PM       AM PM       AM PM       AM PM       AM PM       AM PM       Total Daily amount of Acetaminophen Do not take more than  3,000 mg per day      Day 5    Time  Name of Medication Number of pills taken  Amount of Acetaminophen  Pain Level   Comments  AM PM       AM PM       AM PM       AM PM       AM PM       AM PM       AM PM       AM PM       Total Daily amount of Acetaminophen Do not take more than  3,000 mg per day       Day 6    Time  Name of Medication Number of pills taken  Amount of Acetaminophen  Pain Level  Comments  AM PM       AM PM       AM PM       AM PM  AM PM       AM PM       AM PM       AM PM       Total Daily amount of Acetaminophen Do not take more than  3,000 mg per day      Day 7    Time  Name of Medication Number of pills taken  Amount of Acetaminophen  Pain Level   Comments  AM PM       AM PM       AM PM       AM PM       AM PM       AM PM       AM PM       AM PM       Total Daily amount of Acetaminophen Do not take more than  3,000 mg per day        For additional information about how and where to safely dispose of unused opioid medications - [employer]  Disclaimer: This document contains information and/or instructional materials adapted from [place] for the typical patient with your condition. It does not replace medical advice from your health care provider because your experience may differ from that of the typical patient. Talk to your health care provider if you have any questions about this document, your condition or your treatment plan. Adapted from [place]

## 2019-04-06 NOTE — Progress Notes (Signed)
Pt has order to discharge home, pt states he talked with Dr Andrey Campanile when he rounded shortly ago and planned to send him home this evening. Pt states he told Dr Andrey Campanile he had no one to stay with him for 24 hours post op as Dr Andrey Campanile told the pt he had to have to go home. Pt states that Dr Andrey Campanile agreed with him that he could not go home alone, pt able to get someone to be with him tomorrow.   Hospital pharmacy was able to fill pt's prescriptions. They will hold these scripts in main pharmacy as pt's "home meds" and will need unit staff to get them tomorrow for pt when he is discharged. Pt informed of this and this nurse will inform oncoming nurse of this to pass to day shift nurse tomorrow.

## 2019-04-06 NOTE — Anesthesia Postprocedure Evaluation (Signed)
Anesthesia Post Note  Patient: Mackinley Kiehn  Procedure(s) Performed: LEFT INCARCERATED INGUINAL HERNIA REPAIR WITH MESH (Left Groin)     Patient location during evaluation: PACU Anesthesia Type: General Level of consciousness: awake and alert Pain management: pain level controlled Vital Signs Assessment: post-procedure vital signs reviewed and stable Respiratory status: spontaneous breathing, nonlabored ventilation, respiratory function stable and patient connected to nasal cannula oxygen Cardiovascular status: blood pressure returned to baseline and stable Postop Assessment: no apparent nausea or vomiting Anesthetic complications: no    Last Vitals:  Vitals:   04/06/19 1225 04/06/19 1242  BP: (!) 134/91 (!) 133/97  Pulse: 76 71  Resp: 16 17  Temp:  36.8 C  SpO2: 98% 100%    Last Pain:  Vitals:   04/06/19 1339  TempSrc:   PainSc: 0-No pain                 Izayah Miner COKER

## 2019-04-06 NOTE — Anesthesia Preprocedure Evaluation (Signed)

## 2019-04-06 NOTE — Anesthesia Procedure Notes (Addendum)
Anesthesia Regional Block: TAP block   Pre-Anesthetic Checklist: ,, timeout performed, Correct Patient, Correct Site, Correct Laterality, Correct Procedure, Correct Position, site marked, Risks and benefits discussed, pre-op evaluation,  At surgeon's request and post-op pain management  Laterality: Left  Prep: Maximum Sterile Barrier Precautions used, chloraprep       Needles:  Injection technique: Single-shot  Needle Type: Echogenic Stimulator Needle     Needle Length: 9cm  Needle Gauge: 21     Additional Needles:   Narrative:  Start time: 04/06/2019 9:55 AM End time: 04/06/2019 10:05 AM Injection made incrementally with aspirations every 5 mL.  Performed by: Personally  Anesthesiologist: Kipp Brood, MD  Additional Notes: 30 cc 0.5% Bupivacaine 1:200 epi injected easily

## 2019-04-06 NOTE — Op Note (Signed)
Clifford Green 1955/12/09 [id number] 04/06/2019   Open Repair of Left Incarcerated Direct Inguinal Hernia with Mesh Procedure Note  Indications: The patient presented with a history of a left, not reducible hernia. The patient presented to the emergency room yesterday with complaints of a a reducible bulge in his groin. 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 He had CT scan ultimately that showed an incarcerated fat tissue in the left hernia. He has an extreme phobia of needles and required multiple security guards to hold him down at his request for insertion of a peripheral IV in the emergency room as well as multiple attempts to get blood from the patient. Because of this I recommended just focusing on the more symptomatic hernia today. He was very concerned about postoperative pain control. Please see my preoperative note for additional details.  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 The risks, benefits, complications, treatment options, and expected outcomes were discussed with the patient. The possibilities of reaction to medication, pulmonary aspiration, perforation of viscus, bleeding, recurrent infection, the need for additional procedures, and development of a complication requiring transfusion or further operation were discussed with the patient and/or family. The likelihood of success in repairing the hernia and returning the patient to their previous functional status is good.  There was concurrence with the proposed plan, and informed consent was obtained. The site of surgery was properly noted/marked. The patient was taken to the Operating Room, identified as  Clifford Green, and the procedure verified as left inguinal hernia repair. A Time Out was held and the above information confirmed.  The patient was placed in the supine position and underwent induction of anesthesia. After induction anesthesia performed a left-sided tap block for postoperative analgesia. The lower abdomen and groin was prepped with Chloraprep and draped in the standard fashion, and 0.25% Marcaine with epinephrine was used to anesthetize the skin over the mid-portion of the inguinal canal. An oblique incision was made. Dissection was carried down through the subcutaneous tissue with cautery to the external oblique fascia. There is a slight tear in the external oblique aponeurosis superiorly to the location of the external ring consistent with a "sports hernia". We opened the external oblique fascia along the direction of its fibers to the external ring.  The spermatic cord was circumferentially dissected bluntly and retracted with a Penrose drain. There was an incarcerated piece of omentum medial to the cord structures. I was ultimately able to reduce the incarcerated omentum back into the abdominal cavity. It was viable. After seeing the direct hernia there was essentially no inguinal floor medially. .  We skeletonized the spermatic cord and cord lipoma which was ligated and excised. No evidence of an indirect hernia. Reapproximated the inguinal floor with 4 interrupted 2-0 Prolene sutures going from the conjoined tendon down to the shelving edge of the inguinal ligament.  We used a 3 x 6 inch piece of Ultrapro mesh, which was cut into a keyhole shape.  This was secured with 2-0 Prolene, beginning at the pubic tubercle, running this along the shelving edge inferiorly. Superiorly, the mesh was secured to the internal oblique fascia with interrupted 2-0 Prolene sutures.  The tails of the mesh were sutured together behind the spermatic cord.  The mesh was tucked underneath the external oblique fascia  laterally. The ilioinguinal nerve had been identified and preserved and not incorporated into the repair. The external oblique fascia was reapproximated with 2-0 Vicryl.  3-0 Vicryl was used to close the subcutaneous tissues and 4-0 Monocryl was used to close the skin in subcuticular fashion. Dermabond was used to seal the incision.    The patient was then extubated and brought to the recovery room in stable condition.  All sponge, instrument, and needle counts were correct prior to closure and at the conclusion of the case.   Estimated Blood Loss: Minimal                 Complications: None; patient tolerated the procedure well.         Disposition: PACU - hemodynamically stable.         Condition: stable  Clifford Green. Andrey Campanile, MD, FACS General, Bariatric, & Minimally Invasive Surgery [facility], [place]

## 2019-04-06 NOTE — Interval H&P Note (Signed)
History and Physical Interval Note:  04/06/2019 9:26 AM  Clifford Green  has presented today for surgery, with the diagnosis of Incarcerated Left Inguinal Hernia.  The various methods of treatment have been discussed with the patient and family. After consideration of risks, benefits and other options for treatment, the patient has consented to  Procedure(s): LEFT HERNIA REPAIR INGUINAL INCARCERATED WITH MESH (Left) as a surgical intervention.  The patient's history has been reviewed, patient examined, no change in status, stable for surgery.  I have reviewed the patient's chart and labs.  Questions were answered to the patient's satisfaction.     Discussed with Dr. Cliffton Asters; chart and imaging reviewed Patient reports his left hernia feels a little bit better but is still sticking out.  Normally he is able to reduce it but was not able to reduce it yesterday which prompted his trip to the emergency room.  He does also have a right intermittent groin bulge at times as well that does cause some intermittent symptoms.  He does report some nausea but no diarrhea or abdominal pain other than the left inguinal pain. the patient is extremely phobic of needles and any type of potentially invasive procedure or even such as a needlestick.  He requested and required four security guards to hold him down at his request in order to have an IV catheter placed in the emergency room yesterday.  While he does have bilateral inguinal hernias I am concerned about the possible enteritis in the small bowel with a little bit of ascites so therefore I think a laparoscopic approach would be ill advised because of the potential risk for mesh infection.  The patient is extremely concerned about postoperative pain and discomfort as well more so than I have encountered in my past 10 years of practice.  Therefore I think we should just focus on his acutely symptomatic hernia today and let him recover and see how that experience goes as  opposed to trying to do bilateral open inguinal hernias because of his significant fear for postoperative pain and discomfort.  We did discuss that there are some steps we could do for his right inguinal hernia at a later date to help him preoperatively with anxiety and needle phobia in the short stay area at a later date.   I described the procedure in detail.  We discussed the risks and benefits including but not limited to bleeding, infection, chronic inguinal pain, nerve entrapment, hernia recurrence, mesh complications, hematoma formation, urinary retention, injury to the testicles, numbness in the groin, blood clots, injury to the surrounding structures, and anesthesia risk. We also discussed the typical post operative recovery course, including no heavy lifting for 4-6 weeks. I explained that the likelihood of improvement of their symptoms is good.   We also discussed postoperative pain control.  He has had a history of gastric ulcers so we discussed taking scheduled Tylenol and not adding NSAIDs to that regimen.  Using ice.  We discussed pain control expectation postoperatively.  We will ask anesthesia to perform a tap block once the patient is anesthetized  He has agreed to proceed with open repair of left incarcerated inguinal hernia with mesh.  Clifford Green. Andrey Campanile, MD, FACS General, Bariatric, & Minimally Invasive Surgery [facility] [place]    Clifford Green

## 2019-04-06 NOTE — Transfer of Care (Signed)
Immediate Anesthesia Transfer of Care Note  Patient: Clifford Green  Procedure(s) Performed: LEFT INCARCERATED INGUINAL HERNIA REPAIR WITH MESH (Left Groin)  Patient Location: PACU  Anesthesia Type:General  Level of Consciousness: drowsy and responds to stimulation  Airway & Oxygen Therapy: Patient Spontanous Breathing  Post-op Assessment: Report given to RN and Post -op Vital signs reviewed and stable  Post vital signs: Reviewed and stable  Last Vitals:  Vitals Value Taken Time  BP 108/70 04/06/19 1125  Temp    Pulse 78 04/06/19 1128  Resp 11 04/06/19 1128  SpO2 92 % 04/06/19 1128  Vitals shown include unvalidated device data.  Last Pain:  Vitals:   04/06/19 0814  TempSrc: Oral  PainSc:          Complications: No apparent anesthesia complications

## 2019-04-06 NOTE — Progress Notes (Signed)
Initial Nutrition Assessment  RD working remotely.  DOCUMENTATION CODES:   Underweight  INTERVENTION:   -Once diet is advanced, add:   -Ensure Enlive po TID, each supplement provides 350 kcal and 20 grams of protein -MVI with minerals daily  NUTRITION DIAGNOSIS:   Increased nutrient needs related to post-op healing as evidenced by estimated needs.  GOAL:   Patient will meet greater than or equal to 90% of their needs  MONITOR:   PO intake, Supplement acceptance, Diet advancement, Labs, Weight trends, Skin, I & O's  REASON FOR ASSESSMENT:   Other (Comment)    ASSESSMENT:   Clifford Green is an 64 y.o. male whom denies any prior medical hx presents to ED with left groin pain and bulge.  Pain is described as sharp and nonradiating.  Nothing seems to make it better or worse.  He denies any associated nausea/vomiting/abdominal pain.  He reports an intermittent bulge in his right groin that has been present for years but has never noticed any bulge in the left groin until today.  He denies any history of abdominal surgery or hernia surgery. He reports that since the hernia came out, he has been unable to walk.  Pt admitted with incarcerated fat containing inguinal hernia.   Reviewed I/O's: +479ml x 24 hours  Per MD notes, pt with significant fear of needles; required hydrocodone, security guards holding down, a extended time (hours) to obtain IV access.  Pt down in OR at time of visit. RD unable to obtain further nutrition-related history at this time. He is currently NPO fr surgery.   Reviewed records from [facility]; last recorded wt 125# on 10/04/10. Given weight loss and underweight status, pt is at high risk for malnutrition, however, RD unable to identify at this time. Pt would greatly benefit from addition of oral nutritional supplements once diet is advanced to support post-operative healing.   Medications reviewed and include lactated ringers infusion @ 75 ml/hr.    Labs reviewed.   Diet Order:   Diet Order            Diet NPO time specified  Diet effective midnight              EDUCATION NEEDS:   No education needs have been identified at this time  Skin:  Skin Assessment: Reviewed RN Assessment  Last BM:  04/05/19  Height:   Ht Readings from Last 1 Encounters:  04/05/19 5\' 8"  (1.727 m)    Weight:   Wt Readings from Last 1 Encounters:  04/05/19 49.1 kg    Ideal Body Weight:  70 kg  BMI:  Body mass index is 16.46 kg/m.  Estimated Nutritional Needs:   Kcal:  1750-1950  Protein:  95-110 grams  Fluid:  > 1.7 L    04/07/19, RD, LDN, CDCES Registered Dietitian II Certified Diabetes Care and Education Specialist Please refer to [facility] for RD and/or RD on-call/weekend/after hours pager

## 2019-04-06 NOTE — Anesthesia Procedure Notes (Signed)
Procedure Name: Intubation Date/Time: 04/06/2019 10:00 AM Performed by: Adria Dill, CRNA Pre-anesthesia Checklist: Patient identified, Emergency Drugs available, Suction available and Patient being monitored Patient Re-evaluated:Patient Re-evaluated prior to induction Oxygen Delivery Method: Circle system utilized Preoxygenation: Pre-oxygenation with 100% oxygen Induction Type: IV induction Ventilation: Mask ventilation without difficulty Laryngoscope Size: Miller and 2 Grade View: Grade I Tube type: Oral Tube size: 7.5 mm Number of attempts: 1 Airway Equipment and Method: Stylet and Oral airway Placement Confirmation: ETT inserted through vocal cords under direct vision,  positive ETCO2 and breath sounds checked- equal and bilateral Secured at: 21 cm Tube secured with: Tape Dental Injury: Teeth and Oropharynx as per pre-operative assessment

## 2019-04-06 NOTE — Plan of Care (Signed)

## 2019-04-07 NOTE — Plan of Care (Signed)

## 2019-04-07 NOTE — Progress Notes (Signed)
1 Day Post-Op    CC: Left groin pain  Subjective: Patient looks fine this AM.  Tolerating diet, little more painful this morning.  Well controlled with oral pain medications.  Sites look fine.  Objective: Vital signs in last 24 hours: Temp:  [97.7 F (36.5 C)-98.6 F (37 C)] 98.6 F (37 C) (02/27 0553) Pulse Rate:  [53-78] 53 (02/27 0553) Resp:  [15-19] 18 (02/27 0553) BP: (106-134)/(68-97) 113/76 (02/27 0553) SpO2:  [96 %-100 %] 98 % (02/27 0553) Weight:  [49.1 kg] 49.1 kg (02/26 0841) Last BM Date: 04/06/19 420 p.o. 950 IV 1000 urine Afebrile vital signs are stable No labs Intake/Output from previous day: 02/26 0701 - 02/27 0700 In: 1370 [P.O.:420; I.V.:700; IV Piggyback:250] Out: 1010 [Urine:1000; Blood:10] Intake/Output this shift: Total I/O In: -  Out: 425 [Urine:425]  General appearance: alert, cooperative and no distress Resp: clear to auscultation bilaterally GI: Abdomen soft nontender positive bowel sounds.  He is passing some flatus.  Hernia repair site looks fine.  Lab Results:  Recent Labs    04/05/19 1927  WBC 11.8*  HGB 15.2  HCT 45.4  PLT 301    BMET Recent Labs    04/05/19 1927  NA 141  K 4.2  CL 105  CO2 26  GLUCOSE 129*  BUN 15  CREATININE 0.74  CALCIUM 9.3   PT/INR No results for input(s): LABPROT, INR in the last 72 hours.  No results for input(s): AST, ALT, ALKPHOS, BILITOT, PROT, ALBUMIN in the last 168 hours.   Lipase  No results found for: LIPASE   Medications: . acetaminophen  1,000 mg Oral Q6H  . docusate sodium  100 mg Oral BID  . enoxaparin (LOVENOX) injection  40 mg Subcutaneous Q24H  . gabapentin  300 mg Oral BID  . multivitamin with minerals  1 tablet Oral Daily    Antibiotics Given (last 72 hours)    Date/Time Action Medication Dose   04/06/19 1031 Given  [exp. 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 Gaynelle Adu   FEN: Regular diet ID: None DVT: Lovenox Follow-up: Dr. Andrey Green  Plan: Patient is ready for discharge this AM.  He is getting his medicines from the [facility] pharmacy.  His 62 year old mother is coming to pick him up.  He was given discharge instructions and follow-up instructions.  LOS: 1 day    Clifford Green 04/07/2019 Please see Amion

## 2019-04-07 NOTE — Progress Notes (Signed)
AVS given and reviewed with pt. RN delivered [facility] from [facility] to bedside and discussed with pt. All questions answered to satisfaction. Pt verbalized understanding of information given. Pt to be escorted off the unit with all belongings via wheelchair by staff member.

## 2019-04-07 NOTE — Discharge Summary (Addendum)
Physician Discharge Summary  Patient ID: Clifford Green MRN: [id number] DOB/AGE: [birth year] 64 y.o.  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 Doreatha Massed Course:  Clifford Green is an 64 y.o. male whom denies any prior medical hx presents to ED with left groin pain and bulge.  Pain is described as sharp and nonradiating.  Nothing seems to make it better or worse.  He denies any associated nausea/vomiting/abdominal pain.  He reports an intermittent bulge in his right groin that has been present for years but has never noticed any bulge in the left groin until today.  He denies any history of abdominal surgery or hernia surgery. He reports that since the hernia came out, he has been unable to walk.   He has a significant fear of needles and it took a couple of hours, hydrocodone, and security guards holding him down (at his request) to obtain IV access.  Patient was admitted and seen by Dr. Andrey Campanile the following a.m.  He was taken the operating room and underwent the above-noted procedure.  He tolerated it well and is ready for discharge the following a.m.  Condition on discharge: Improved  CBC Latest Ref Rng & Units 04/05/2019  WBC 4.0 - 10.5 K/uL 11.8(H)  Hemoglobin 13.0 - 17.0 g/dL 30.0  Hematocrit 92.3 - 52.0 % 45.4  Platelets 150 - 400 K/uL 301   CMP Latest Ref Rng & Units 04/05/2019  Glucose 70 - 99 mg/dL 300(T)  BUN 8 - 23 mg/dL 15  Creatinine 6.22 - 6.33 mg/dL 3.54  Sodium 562 - 563 mmol/L 141  Potassium 3.5 - 5.1 mmol/L 4.2  Chloride 98 - 111 mmol/L 105  CO2 22 - 32 mmol/L 26  Calcium 8.9 - 10.3 mg/dL 9.3     Disposition: Discharge disposition: 01-Home or Self Care       Discharge Instructions     Call MD for:   Complete by: As directed    Temperature >101   Call MD for:  hives   Complete by: As directed    Call MD for:  persistant dizziness or light-headedness   Complete by: As directed    Call MD for:  persistant nausea and vomiting   Complete by: As directed    Call MD for:  redness, tenderness, or signs of infection (pain, swelling, redness, odor or green/yellow discharge around incision site)   Complete by: As directed    Call MD for:  severe uncontrolled pain   Complete by: As directed    Diet general   Complete by: As directed    Discharge instructions   Complete by: As directed    See [facility] discharge instructions   Increase activity slowly   Complete by: As directed       Allergies as of 04/07/2019       Reactions   Codeine Other (See Comments)   Mood altering   Penicillins Rash   Did it involve swelling of the face/tongue/throat, SOB, or low BP? No Did it involve sudden or severe rash/hives, skin peeling, or any reaction on the inside of your mouth or nose? Yes Did you need to seek medical attention at a hospital or doctor's office? Yes  When did it last happen?  childhood     If all above answers are "NO", may proceed with cephalosporin use.   Sulfa Antibiotics Rash        Medication List     TAKE these medications    acetaminophen 500 MG tablet Commonly known as: TYLENOL Take 2 tablets (1,000 mg total) by mouth every 8 (eight) hours for 5 days.   cetirizine 10 MG tablet Commonly known as: ZYRTEC Take 10 mg by mouth at bedtime.   docusate sodium 100 MG capsule Commonly known as: COLACE Take 1 capsule (100 mg total) by mouth 2 (two) times daily.   omeprazole 20 MG capsule Commonly known as: PRILOSEC Take 20 mg by mouth every evening.   oxyCODONE 5 MG immediate release tablet Commonly known as: Oxy IR/ROXICODONE Take 1 tablet (5 mg total) by  mouth every 6 (six) hours as needed for severe pain.       Follow-up Information     [place]. Schedule an appointment as soon as possible for a visit.   Contact information: [place] [phone] [facility], [place]. Go on 04/24/2019.   Specialty: General Surgery Why: Please arrive at 1030am for an 11am appointment. Please bring a copy of your photo ID and insurance  Contact information: [place] [phone]           Signed: Earnstine Regal 04/07/2019, 8:06 AM

## 2019-05-16 ENCOUNTER — Ambulatory Visit: Payer: Self-pay | Admitting: General Surgery

## 2019-05-16 NOTE — H&P (View-Only) (Signed)
Clifford Green Documented: 05/16/2019 10:21 AM Location: [facility] Patient #: [id number] DOB: [birth year] Single / Language: Clifford Green / Race: White Male  History of Present Illness Clifford Hiss M. Tabias Swayze MD; 05/16/2019 10:58 AM) The patient is a 64 year old male who presents with an inguinal hernia. Patient comes in today to discuss his right inguinal hernia. We initially met in February of this year for urgent surgery. He came in with a very large incarcerated left inguinal hernia and underwent open repair with mesh. We did not repair his contralateral hernia at the same time due to several issues. The patient has an extreme significant severe needle phobia. It took multiple hours in the emergency room for him to be properly evaluated. Patient ultimately requested arm security guards to physically restrain him why they did his peripheral blood draw and inserted IV catheter. The patient states that he is ready to get his contralateral her hernia fixed. It is bother him. It is causing pain. He is having to wear a truss. It is getting harder for him to reduce it. It is not been hard or firm completely irreducible. He did have some constipation after his first surgery. He requests no oxycodone on discharge he just wants the prescription for Tylenol for a longer period of time. He continues to smoke but denies any chest pain, chest pressure, source of breath, TIAs or amaurosis fugax. Rare alcohol. No drug use. He has some numbness around his incision from his contralateral repair but has not had any burning, shooting or stabbing pain   Problem List/Past Medical Clifford Hiss M. 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 Redmond Pulling, MD; 05/16/2019 10:59 AM) Oral Surgery  Allergies [name], CMA; 05/16/2019 10:21 AM) Penicillins Sulfa Drugs Allergies  Reconciled  Medication History [name], CMA; 05/16/2019 10:21 AM) No Current Medications Medications Reconciled  Social History Clifford Hiss M. Redmond Pulling, MD; 05/16/2019 10:59 AM) Alcohol use Occasional alcohol use. No caffeine use Tobacco use Current some day smoker.  Other Problems Clifford Hiss M. Redmond Pulling, MD; 05/16/2019 10:59 AM) Gastric Ulcer Inguinal Hernia     Review of Systems Clifford Hiss M. Igor Bishop MD; 05/16/2019 10:56 AM) All other systems negative  Vitals [name] CMA; 05/16/2019 10:22 AM) 05/16/2019 10:22 AM Weight: 111.6 lb Height: 68in Body Surface Area: 1.6 m Body Mass Index: 16.97 kg/m  Temp.: 98.65F(Tympanic)  Pulse: 112 (Regular)  BP: 122/62 (Sitting, Left Arm, Standard)        Physical Exam Clifford Hiss M. Taksh Hjort MD; 05/16/2019 10:56 AM)  General Mental Status-Alert. General Appearance-Consistent with stated age. Hydration-Well hydrated. Voice-Normal.  Head and Neck Head-normocephalic, atraumatic with no lesions or palpable masses. Trachea-midline. Thyroid Gland Characteristics - normal size and consistency.  Eye Eyeball - Bilateral-Extraocular movements intact. Sclera/Conjunctiva - Bilateral-No scleral icterus.  Chest and Lung Exam Chest and lung exam reveals -quiet, even and easy respiratory effort with no use of accessory muscles and on auscultation, normal breath sounds, no adventitious sounds and normal vocal resonance. Inspection Chest Wall - Normal. Back - normal.  Breast - Did not examine.  Cardiovascular Cardiovascular examination reveals -normal heart sounds, regular rate and rhythm with no murmurs and normal pedal pulses bilaterally.  Abdomen Inspection Inspection of the abdomen reveals - No Hernias. Skin - Scar - no surgical scars. Palpation/Percussion Palpation and Percussion of the abdomen reveal - Soft, Non Tender, No Rebound tenderness, No Rigidity (guarding) and No  hepatosplenomegaly. Auscultation Auscultation  of the abdomen reveals - Bowel sounds normal.  Male Genitourinary Note: Well-healed left inguinal incision. No bulge. No evidence of recurrent hernia. Both testicles descended. Obvious right groin bulge. Easily reducible. Patient wearing a truss  Peripheral Vascular Upper Extremity Palpation - Pulses bilaterally normal.  Neurologic Neurologic evaluation reveals -alert and oriented x 3 with no impairment of recent or remote memory. Mental Status-Normal.  Neuropsychiatric The patient's mood and affect are described as -normal. Judgment and Insight-insight is appropriate concerning matters relevant to self.  Musculoskeletal Normal Exam - Left-Upper Extremity Strength Normal and Lower Extremity Strength Normal. Normal Exam - Right-Upper Extremity Strength Normal and Lower Extremity Strength Normal.  Lymphatic Head & Neck  General Head & Neck Lymphatics: Bilateral - Description - Normal. Axillary - Did not examine. Femoral & Inguinal - Did not examine.    Assessment & Plan Clifford Hiss M. Emmanuell Kantz MD; 05/16/2019 10:59 AM)  RIGHT INGUINAL HERNIA (K40.90) Impression: We discussed the etiology of inguinal hernias. We discussed the signs & symptoms of incarceration & strangulation. We discussed non-operative and operative management.  The patient has elected to proceed with OPEN REPAIR OF RIGHT INGUINAL HERNIA WITH MESH   I described the procedure in detail. The patient was given educational material. We discussed the risks and benefits including but not limited to bleeding, infection, chronic inguinal pain, nerve entrapment, hernia recurrence, mesh complications, hematoma formation, urinary retention, injury to the testicle, numbness in the groin, blood clots, injury to the surrounding structures, and anesthesia risk. We also discussed the typical post operative recovery course, including no heavy lifting for 4-6 weeks. I explained that  the likelihood of improvement of their symptoms is GOOD.  We did discuss that he is at slight increased risk for recurrence as well as infection given his tobacco use.  This patient encounter took 23 minutes today to perform the following: take history, perform exam, review outside records, interpret imaging, counsel the patient on their diagnosis and document encounter, findings & plan in the EHR  He may also need to stay overnight for observation due to lack of transportation. His 68 year old mother is his immediate family and she cannot drive. He states it is possible that there is a chance he may find someone he can take him home same day but not a guarantee  Current Plans Pt Education - Pamphlet Given - Hernia Surgery: discussed with patient and provided information. You are being scheduled for surgery- Our schedulers will call you.  You should hear from our office's scheduling department within 5 working days about the location, date, and time of surgery. We try to make accommodations for patient's preferences in scheduling surgery, but sometimes the OR schedule or the surgeon's schedule prevents [place] from making those accommodations.  If you have not heard from our office [phone]) in 5 working days, call the office and ask for your surgeon's nurse.  If you have other questions about your diagnosis, plan, or surgery, call the office and ask for your surgeon's nurse.   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 During his hospitalization in February patient requested that security physically restrain him while an IV catheter was inserted in the emergency department. It took a significant amount of time in the emergency room for him to get evaluated due to his needle phobia. While he was admitted I did discuss his case with the anesthesiologist with regard to the pending repair of his contralateral hernia in the future. Anesthesia felt that  he could probably have light sedation without IV directly in the operating room and then placed IV catheter after establishment of inhalation and/or oral sedation and then undergo general anesthesia. We will do a preoperative anesthesia consult. I don't need preoperative labs since the patient has a severe needle phobia and cannot even tolerate a peripheral blood draw without being restrained or sedated  Leighton Ruff. Redmond Pulling, MD, FACS General, Bariatric, & Minimally Invasive Surgery [facility], [place]

## 2019-05-16 NOTE — H&P (Signed)
Clifford Green Documented: 05/16/2019 10:21 AM Location: [facility] Patient #: [id number] DOB: 08-Mar-1955 Single / Language: Clifford Green / Race: White Male  History of Present Illness Clifford Hiss M. Paulina Muchmore MD; 05/16/2019 10:58 AM) The patient is a 64 year old male who presents with an inguinal hernia. Patient comes in today to discuss his right inguinal hernia. We initially met in February of this year for urgent surgery. He came in with a very large incarcerated left inguinal hernia and underwent open repair with mesh. We did not repair his contralateral hernia at the same time due to several issues. The patient has an extreme significant severe needle phobia. It took multiple hours in the emergency room for him to be properly evaluated. Patient ultimately requested arm security guards to physically restrain him why they did his peripheral blood draw and inserted IV catheter. The patient states that he is ready to get his contralateral her hernia fixed. It is bother him. It is causing pain. He is having to wear a truss. It is getting harder for him to reduce it. It is not been hard or firm completely irreducible. He did have some constipation after his first surgery. He requests no oxycodone on discharge he just wants the prescription for Tylenol for a longer period of time. He continues to smoke but denies any chest pain, chest pressure, source of breath, TIAs or amaurosis fugax. Rare alcohol. No drug use. He has some numbness around his incision from his contralateral repair but has not had any burning, shooting or stabbing pain   Problem List/Past Medical Clifford Hiss M. 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 Redmond Pulling, MD; 05/16/2019 10:59 AM) Oral Surgery  Allergies [name], CMA; 05/16/2019 10:21 AM) Penicillins Sulfa Drugs Allergies  Reconciled  Medication History [name], CMA; 05/16/2019 10:21 AM) No Current Medications Medications Reconciled  Social History Clifford Hiss M. Redmond Pulling, MD; 05/16/2019 10:59 AM) Alcohol use Occasional alcohol use. No caffeine use Tobacco use Current some day smoker.  Other Problems Clifford Hiss M. Redmond Pulling, MD; 05/16/2019 10:59 AM) Gastric Ulcer Inguinal Hernia     Review of Systems Clifford Hiss M. Damiya Sandefur MD; 05/16/2019 10:56 AM) All other systems negative  Vitals [name] CMA; 05/16/2019 10:22 AM) 05/16/2019 10:22 AM Weight: 111.6 lb Height: 68in Body Surface Area: 1.6 m Body Mass Index: 16.97 kg/m  Temp.: 98.70F(Tympanic)  Pulse: 112 (Regular)  BP: 122/62 (Sitting, Left Arm, Standard)        Physical Exam Clifford Hiss M. Sabiha Sura MD; 05/16/2019 10:56 AM)  General Mental Status-Alert. General Appearance-Consistent with stated age. Hydration-Well hydrated. Voice-Normal.  Head and Neck Head-normocephalic, atraumatic with no lesions or palpable masses. Trachea-midline. Thyroid Gland Characteristics - normal size and consistency.  Eye Eyeball - Bilateral-Extraocular movements intact. Sclera/Conjunctiva - Bilateral-No scleral icterus.  Chest and Lung Exam Chest and lung exam reveals -quiet, even and easy respiratory effort with no use of accessory muscles and on auscultation, normal breath sounds, no adventitious sounds and normal vocal resonance. Inspection Chest Wall - Normal. Back - normal.  Breast - Did not examine.  Cardiovascular Cardiovascular examination reveals -normal heart sounds, regular rate and rhythm with no murmurs and normal pedal pulses bilaterally.  Abdomen Inspection Inspection of the abdomen reveals - No Hernias. Skin - Scar - no surgical scars. Palpation/Percussion Palpation and Percussion of the abdomen reveal - Soft, Non Tender, No Rebound tenderness, No Rigidity (guarding) and No  hepatosplenomegaly. Auscultation Auscultation  of the abdomen reveals - Bowel sounds normal.  Male Genitourinary Note: Well-healed left inguinal incision. No bulge. No evidence of recurrent hernia. Both testicles descended. Obvious right groin bulge. Easily reducible. Patient wearing a truss  Peripheral Vascular Upper Extremity Palpation - Pulses bilaterally normal.  Neurologic Neurologic evaluation reveals -alert and oriented x 3 with no impairment of recent or remote memory. Mental Status-Normal.  Neuropsychiatric The patient's mood and affect are described as -normal. Judgment and Insight-insight is appropriate concerning matters relevant to self.  Musculoskeletal Normal Exam - Left-Upper Extremity Strength Normal and Lower Extremity Strength Normal. Normal Exam - Right-Upper Extremity Strength Normal and Lower Extremity Strength Normal.  Lymphatic Head & Neck  General Head & Neck Lymphatics: Bilateral - Description - Normal. Axillary - Did not examine. Femoral & Inguinal - Did not examine.    Assessment & Plan Clifford Hiss M. Paitlyn Mcclatchey MD; 05/16/2019 10:59 AM)  RIGHT INGUINAL HERNIA (K40.90) Impression: We discussed the etiology of inguinal hernias. We discussed the signs & symptoms of incarceration & strangulation. We discussed non-operative and operative management.  The patient has elected to proceed with OPEN REPAIR OF RIGHT INGUINAL HERNIA WITH MESH   I described the procedure in detail. The patient was given educational material. We discussed the risks and benefits including but not limited to bleeding, infection, chronic inguinal pain, nerve entrapment, hernia recurrence, mesh complications, hematoma formation, urinary retention, injury to the testicle, numbness in the groin, blood clots, injury to the surrounding structures, and anesthesia risk. We also discussed the typical post operative recovery course, including no heavy lifting for 4-6 weeks. I explained that  the likelihood of improvement of their symptoms is GOOD.  We did discuss that he is at slight increased risk for recurrence as well as infection given his tobacco use.  This patient encounter took 23 minutes today to perform the following: take history, perform exam, review outside records, interpret imaging, counsel the patient on their diagnosis and document encounter, findings & plan in the EHR  He may also need to stay overnight for observation due to lack of transportation. His 81 year old mother is his immediate family and she cannot drive. He states it is possible that there is a chance he may find someone he can take him home same day but not a guarantee  Current Plans Pt Education - Pamphlet Given - Hernia Surgery: discussed with patient and provided information. You are being scheduled for surgery- Our schedulers will call you.  You should hear from our office's scheduling department within 5 working days about the location, date, and time of surgery. We try to make accommodations for patient's preferences in scheduling surgery, but sometimes the OR schedule or the surgeon's schedule prevents [place] from making those accommodations.  If you have not heard from our office [phone]) in 5 working days, call the office and ask for your surgeon's nurse.  If you have other questions about your diagnosis, plan, or surgery, call the office and ask for your surgeon's nurse.   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 During his hospitalization in February patient requested that security physically restrain him while an IV catheter was inserted in the emergency department. It took a significant amount of time in the emergency room for him to get evaluated due to his needle phobia. While he was admitted I did discuss his case with the anesthesiologist with regard to the pending repair of his contralateral hernia in the future. Anesthesia felt that  he could probably have light sedation without IV directly in the operating room and then placed IV catheter after establishment of inhalation and/or oral sedation and then undergo general anesthesia. We will do a preoperative anesthesia consult. I don't need preoperative labs since the patient has a severe needle phobia and cannot even tolerate a peripheral blood draw without being restrained or sedated  Leighton Ruff. Redmond Pulling, MD, FACS General, Bariatric, & Minimally Invasive Surgery [facility], [place]

## 2019-05-29 ENCOUNTER — Other Ambulatory Visit (HOSPITAL_COMMUNITY)
Admission: RE | Admit: 2019-05-29 | Discharge: 2019-05-29 | Disposition: A | Discharge status: Home / Self Care | Payer: MEDICAID | Source: Ambulatory Visit | Attending: General Surgery | Admitting: General Surgery

## 2019-05-29 DIAGNOSIS — Z20822 Contact with and (suspected) exposure to covid-19: Secondary | ICD-10-CM | POA: Insufficient documentation

## 2019-05-29 DIAGNOSIS — Z01812 Encounter for preprocedural laboratory examination: Secondary | ICD-10-CM | POA: Insufficient documentation

## 2019-05-29 LAB — SARS CORONAVIRUS 2 (TAT 6-24 HRS): SARS Coronavirus 2: NEGATIVE

## 2019-05-30 ENCOUNTER — Encounter (HOSPITAL_COMMUNITY): Payer: Self-pay | Admitting: General Surgery

## 2019-05-30 ENCOUNTER — Other Ambulatory Visit: Payer: Self-pay

## 2019-05-30 NOTE — Progress Notes (Signed)
Pt denies SOB, chest pain, and being under the care of a cardiologist and PCP. Pt denies having a stress test, echo and cardiac cath. Pt denies having an EKG and chest x ray. Pt denies recent labs. Pt made aware to stop taking Aspirin (unless otherwise advised by surgeon), vitamins, fish oil and herbal medications. Do not take any NSAIDs ie: Ibuprofen, Advil, Naproxen (Aleve), Motrin, BC and Goody Powder. Pt reminded to quarantine. Pt verbalized understanding of all pre-op instructions. PA, Anesthesiology made aware of ORDER FOR CONSULT.

## 2019-05-30 NOTE — Progress Notes (Signed)
Anesthesia Chart Review: Clifford Green   Case: [id number] Date/Time: 06/01/19 0715   Procedure: OPEN REPAIR RIGHT INGUINAL HERNIA WITH MESH (Right )   Anesthesia type: General   Pre-op diagnosis: RIGHT INGUINAL HERNIA   [facility] ROOM 01 / [facility]   Surgeons: Gaynelle Adu, MD      DISCUSSION: Patient is a 64 year old male scheduled for the above procedure. HE HAS A SEVERE NEEDLE PHOBIA!!  Other history includes smoking, gastric ulcer, hard of hearing/hearing aids, bilateral inguinal hernias (s/p left IHR 04/06/19).  He presented to ED on 04/05/19 with incarcerated left direct inguinal hernia that could not be reduced in the ED. Attempts in the ED were challenging due to patient refusing IV for pain medications due to his needle phobia (history of getting violent when approached by needles due to his extreme fear). After at least six hours and patient reluctantly agreed for PIV but security guards required to physically restrain him (at his request) to get peripheral blood draw and insert PIV. Afterwards, he was taken urgently to the OR where he underwent open repair of left incarcerated direct inguinal hernia with mesh on 04/06/19.   Dr. Andrey Campanile spoke with anesthesiologist Autumn Patty, MD regarding patient's severe needle phobia. It's possible he may require inhalation induction with labs and PIV afterwards, but ultimately anesthesia plan to be determined by his assigned anesthesiologist.   05/29/19 presurgical COVID-19 test negative.   VS: On 04/07/19, BP 112/75, HR 61.    PROVIDERS: Patient, No Pcp Per   LABS: Labs on 04/05/19 showed Cr 0.75, glucose 129, H/H 15.2/45.2, PLT 301. As above, any additional labs for this hospital encounter may have to be drawn under sedation/anesthesia.   EKG: N/A   CV: N/A  Past Medical History:  Diagnosis Date  . Allergies   . Hearing aid worn    B/L  . History of stomach ulcers   . HOH (hard of hearing)   . Inguinal hernia recurrent  bilateral   . Severe needle phobia   . Wears glasses     Past Surgical History:  Procedure Laterality Date  . INGUINAL HERNIA REPAIR Left 04/06/2019   Procedure: LEFT INCARCERATED INGUINAL HERNIA REPAIR WITH MESH;  Surgeon: Gaynelle Adu, MD;  Location: [facility] OR;  Service: General;  Laterality: Left;    MEDICATIONS: No current facility-administered medications for this encounter.   . cetirizine (ZYRTEC) 10 MG tablet  . diphenhydrAMINE HCl (ZZZQUIL) 50 MG/30ML LIQD  . omeprazole (PRILOSEC) 20 MG capsule  . Tetrahydrozoline HCl (VISINE OP)  . docusate sodium (COLACE) 100 MG capsule  . oxyCODONE (OXY IR/ROXICODONE) 5 MG immediate release tablet  . polyethylene glycol (MIRALAX / GLYCOLAX) 17 g packet     Shonna Chock, PA-C Surgical Short Stay/Anesthesiology [facility] Phone [phone] [facility] Phone [phone] 05/31/2019 11:56 AM

## 2019-05-31 NOTE — Anesthesia Preprocedure Evaluation (Addendum)
Anesthesia Evaluation  Patient identified by MRN, date of birth, ID band Patient awake    Reviewed: Allergy & Precautions, NPO status , Patient's Chart, lab work & pertinent test results  Airway Mallampati: III  TM Distance: >3 FB Neck ROM: Full    Dental no notable dental hx.    Pulmonary Current Smoker and Patient abstained from smoking.,    Pulmonary exam normal breath sounds clear to auscultation       Cardiovascular negative cardio ROS Normal cardiovascular exam Rhythm:Regular Rate:Normal     Neuro/Psych Anxiety Severe needle phobianegative neurological ROS     GI/Hepatic Neg liver ROS, PUD,   Endo/Other  negative endocrine ROS  Renal/GU negative Renal ROS     Musculoskeletal negative musculoskeletal ROS (+)   Abdominal   Peds  Hematology negative hematology ROS (+)   Anesthesia Other Findings RIGHT INGUINAL HERNIA  Reproductive/Obstetrics                            Anesthesia Physical Anesthesia Plan  ASA: II  Anesthesia Plan: General and Regional   Post-op Pain Management: GA combined w/ Regional for post-op pain   Induction: Inhalational  PONV Risk Score and Plan: 1 and Ondansetron, Dexamethasone, Midazolam and Treatment may vary due to age or medical condition  Airway Management Planned: LMA  Additional Equipment:   Intra-op Plan:   Post-operative Plan: Extubation in OR  Informed Consent: I have reviewed the patients History and Physical, chart, labs and discussed the procedure including the risks, benefits and alternatives for the proposed anesthesia with the patient or authorized representative who has indicated his/her understanding and acceptance.     Dental advisory given  Plan Discussed with: CRNA  Anesthesia Plan Comments: (Per PA-C: PAT note written 05/31/2019 by Shonna Chock, PA-C. SAME DAY WORK-UP  HE HAS A SEVERE NEEDLE PHOBIA!! History of  becoming violent when approached with needles due to his severe fear of needles. After 6 hours with incarcerated left IHR in 04/05/19 he required security guards to hold his arms down at his request to get labs/PIV and go to OR for urgent repair.   Dr. Andrey Campanile spoke with Autumn Patty, MD regarding patient's severe needle phobia. It's possible he may require inhalation induction with labs and PIV afterwards, but ultimately anesthesia plan to be determined by his assigned anesthesiologist. )     Anesthesia Quick Evaluation

## 2019-06-01 ENCOUNTER — Observation Stay (HOSPITAL_COMMUNITY)
Admission: RE | Admit: 2019-06-01 | Discharge: 2019-06-02 | Disposition: A | Discharge status: Home / Self Care | Payer: BC Managed Care – PPO | Attending: General Surgery | Admitting: General Surgery

## 2019-06-01 ENCOUNTER — Ambulatory Visit (HOSPITAL_COMMUNITY): Payer: BC Managed Care – PPO | Admitting: Vascular Surgery

## 2019-06-01 ENCOUNTER — Encounter (HOSPITAL_COMMUNITY): Payer: Self-pay | Admitting: General Surgery

## 2019-06-01 ENCOUNTER — Other Ambulatory Visit: Payer: Self-pay

## 2019-06-01 ENCOUNTER — Encounter (HOSPITAL_COMMUNITY)
Admission: RE | Disposition: A | Discharge status: Home / Self Care | Payer: Self-pay | Source: Home / Self Care | Attending: General Surgery

## 2019-06-01 DIAGNOSIS — H9190 Unspecified hearing loss, unspecified ear: Secondary | ICD-10-CM | POA: Diagnosis not present

## 2019-06-01 DIAGNOSIS — F17200 Nicotine dependence, unspecified, uncomplicated: Secondary | ICD-10-CM | POA: Diagnosis not present

## 2019-06-01 DIAGNOSIS — K4090 Unilateral inguinal hernia, without obstruction or gangrene, not specified as recurrent: Principal | ICD-10-CM | POA: Insufficient documentation

## 2019-06-01 DIAGNOSIS — F40231 Fear of injections and transfusions: Secondary | ICD-10-CM | POA: Diagnosis not present

## 2019-06-01 DIAGNOSIS — Z8719 Personal history of other diseases of the digestive system: Secondary | ICD-10-CM

## 2019-06-01 HISTORY — DX: Unspecified hearing loss, unspecified ear: H91.90

## 2019-06-01 HISTORY — DX: Personal history of peptic ulcer disease: Z87.11

## 2019-06-01 HISTORY — DX: Allergy, unspecified, initial encounter: T78.40XA

## 2019-06-01 HISTORY — DX: Fear of injections and transfusions: F40.231

## 2019-06-01 HISTORY — PX: INSERTION OF MESH: SHX5868

## 2019-06-01 HISTORY — DX: Bilateral inguinal hernia, without obstruction or gangrene, recurrent: K40.21

## 2019-06-01 HISTORY — DX: Presence of spectacles and contact lenses: Z97.3

## 2019-06-01 HISTORY — DX: Presence of external hearing-aid: Z97.4

## 2019-06-01 HISTORY — PX: INGUINAL HERNIA REPAIR: SHX194

## 2019-06-01 LAB — SURGICAL PCR SCREEN
MRSA, PCR: NEGATIVE
Staphylococcus aureus: NEGATIVE

## 2019-06-01 SURGERY — REPAIR, HERNIA, INGUINAL, ADULT
Anesthesia: Regional | Site: Inguinal | Laterality: Right

## 2019-06-01 MED ORDER — LIDOCAINE 2% (20 MG/ML) 5 ML SYRINGE
INTRAMUSCULAR | Status: AC
  Filled 2019-06-01: qty 5

## 2019-06-01 MED ORDER — FENTANYL CITRATE (PF) 100 MCG/2ML IJ SOLN: 25.0000 ug | INTRAMUSCULAR | Status: DC | PRN

## 2019-06-01 MED ORDER — GABAPENTIN 100 MG PO CAPS
200.0000 mg | ORAL_CAPSULE | Freq: Two times a day (BID) | ORAL | Status: DC
  Administered 2019-06-01 – 2019-06-02 (×3): 200 mg via ORAL
  Filled 2019-06-01 (×3): qty 2

## 2019-06-01 MED ORDER — PANTOPRAZOLE SODIUM 40 MG IV SOLR
40.0000 mg | Freq: Every day | INTRAVENOUS | Status: DC
  Administered 2019-06-01: 40 mg via INTRAVENOUS
  Filled 2019-06-01: qty 40

## 2019-06-01 MED ORDER — FENTANYL CITRATE (PF) 250 MCG/5ML IJ SOLN
INTRAMUSCULAR | Status: AC
  Filled 2019-06-01: qty 5

## 2019-06-01 MED ORDER — KCL IN DEXTROSE-NACL 20-5-0.45 MEQ/L-%-% IV SOLN
INTRAVENOUS | Status: DC
  Filled 2019-06-01: qty 1000

## 2019-06-01 MED ORDER — BUPIVACAINE HCL (PF) 0.25 % IJ SOLN
INTRAMUSCULAR | Status: DC | PRN
  Administered 2019-06-01: 10 mL

## 2019-06-01 MED ORDER — BUPIVACAINE HCL (PF) 0.25 % IJ SOLN
INTRAMUSCULAR | Status: AC
  Filled 2019-06-01: qty 30

## 2019-06-01 MED ORDER — ONDANSETRON HCL 4 MG/2ML IJ SOLN: 4.0000 mg | Freq: Four times a day (QID) | INTRAMUSCULAR | Status: DC | PRN

## 2019-06-01 MED ORDER — CLONIDINE HCL (ANALGESIA) 100 MCG/ML EP SOLN
EPIDURAL | Status: DC | PRN
  Administered 2019-06-01: 100 ug

## 2019-06-01 MED ORDER — ONDANSETRON HCL 4 MG/2ML IJ SOLN
INTRAMUSCULAR | Status: DC | PRN
  Administered 2019-06-01: 4 mg via INTRAVENOUS

## 2019-06-01 MED ORDER — MIDAZOLAM HCL 5 MG/5ML IJ SOLN
INTRAMUSCULAR | Status: DC | PRN
  Administered 2019-06-01: 5 mg via INTRAVENOUS

## 2019-06-01 MED ORDER — DEXMEDETOMIDINE HCL IN NACL 200 MCG/50ML IV SOLN
INTRAVENOUS | Status: DC | PRN
Start: 2019-06-01 — End: 2019-06-01
  Administered 2019-06-01 (×5): 4 ug via INTRAVENOUS

## 2019-06-01 MED ORDER — PROPOFOL 10 MG/ML IV BOLUS
INTRAVENOUS | Status: AC
  Filled 2019-06-01: qty 20

## 2019-06-01 MED ORDER — HYDROMORPHONE HCL 1 MG/ML IJ SOLN
INTRAMUSCULAR | Status: AC
  Filled 2019-06-01: qty 1

## 2019-06-01 MED ORDER — ACETAMINOPHEN 500 MG PO TABS
1000.0000 mg | ORAL_TABLET | Freq: Four times a day (QID) | ORAL | Status: DC
  Administered 2019-06-01 – 2019-06-02 (×3): 1000 mg via ORAL
  Filled 2019-06-01 (×3): qty 2

## 2019-06-01 MED ORDER — KETOROLAC TROMETHAMINE 15 MG/ML IJ SOLN
15.0000 mg | Freq: Three times a day (TID) | INTRAMUSCULAR | Status: DC
  Administered 2019-06-01 – 2019-06-02 (×3): 15 mg via INTRAVENOUS
  Filled 2019-06-01 (×3): qty 1

## 2019-06-01 MED ORDER — MUPIROCIN 2 % EX OINT
TOPICAL_OINTMENT | CUTANEOUS | Status: AC
  Administered 2019-06-01: 1 via TOPICAL
  Filled 2019-06-01: qty 22

## 2019-06-01 MED ORDER — GABAPENTIN 300 MG PO CAPS
300.0000 mg | ORAL_CAPSULE | ORAL | Status: AC
  Administered 2019-06-01: 07:00:00 300 mg via ORAL
  Filled 2019-06-01: qty 1

## 2019-06-01 MED ORDER — KETOROLAC TROMETHAMINE 30 MG/ML IJ SOLN: 30.0000 mg | Freq: Once | INTRAMUSCULAR | Status: DC | PRN

## 2019-06-01 MED ORDER — STERILE WATER FOR IRRIGATION IR SOLN
Status: DC | PRN
  Administered 2019-06-01: 1000 mL

## 2019-06-01 MED ORDER — FENTANYL CITRATE (PF) 100 MCG/2ML IJ SOLN
INTRAMUSCULAR | Status: DC | PRN
  Administered 2019-06-01: 50 ug via INTRAVENOUS
  Administered 2019-06-01: 100 ug via INTRAVENOUS

## 2019-06-01 MED ORDER — TETRAHYDROZOLINE HCL 0.05 % OP SOLN
1.0000 [drp] | Freq: Three times a day (TID) | OPHTHALMIC | Status: DC | PRN
  Filled 2019-06-01: qty 15

## 2019-06-01 MED ORDER — HYDROMORPHONE HCL 1 MG/ML IJ SOLN
0.2500 mg | INTRAMUSCULAR | Status: DC | PRN
  Administered 2019-06-01: 0.5 mg via INTRAVENOUS

## 2019-06-01 MED ORDER — CHLORHEXIDINE GLUCONATE CLOTH 2 % EX PADS: 6.0000 | MEDICATED_PAD | Freq: Once | CUTANEOUS | Status: DC

## 2019-06-01 MED ORDER — MIDAZOLAM HCL 2 MG/2ML IJ SOLN
INTRAMUSCULAR | Status: AC
  Filled 2019-06-01: qty 2

## 2019-06-01 MED ORDER — ROPIVACAINE HCL 5 MG/ML IJ SOLN
INTRAMUSCULAR | Status: DC | PRN
Start: 2019-06-01 — End: 2019-06-01
  Administered 2019-06-01: 30 mL via EPIDURAL

## 2019-06-01 MED ORDER — PROMETHAZINE HCL 25 MG/ML IJ SOLN: 6.2500 mg | INTRAMUSCULAR | Status: DC | PRN

## 2019-06-01 MED ORDER — TRAMADOL HCL 50 MG PO TABS
50.0000 mg | ORAL_TABLET | Freq: Four times a day (QID) | ORAL | Status: DC | PRN
  Administered 2019-06-02 (×2): 50 mg via ORAL
  Filled 2019-06-01 (×2): qty 1

## 2019-06-01 MED ORDER — ROCURONIUM BROMIDE 10 MG/ML (PF) SYRINGE
PREFILLED_SYRINGE | INTRAVENOUS | Status: AC
  Filled 2019-06-01: qty 10

## 2019-06-01 MED ORDER — FENTANYL CITRATE 100 MCG BU TABS: 100.0000 ug | ORAL_TABLET | Freq: Once | BUCCAL | Status: DC

## 2019-06-01 MED ORDER — DEXAMETHASONE SODIUM PHOSPHATE 10 MG/ML IJ SOLN
INTRAMUSCULAR | Status: DC | PRN
Start: 2019-06-01 — End: 2019-06-01
  Administered 2019-06-01: 10 mg via INTRAVENOUS

## 2019-06-01 MED ORDER — ENSURE PRE-SURGERY PO LIQD: 296.0000 mL | Freq: Once | ORAL | Status: DC

## 2019-06-01 MED ORDER — ACETAMINOPHEN 500 MG PO TABS
1000.0000 mg | ORAL_TABLET | ORAL | Status: AC
  Administered 2019-06-01: 1000 mg via ORAL
  Filled 2019-06-01: qty 2

## 2019-06-01 MED ORDER — MIDAZOLAM 5 MG/ML ADULT INJ FOR INTRANASAL USE (MC USE ONLY): 5.0000 mg | Freq: Once | INTRAMUSCULAR | Status: DC

## 2019-06-01 MED ORDER — EPHEDRINE SULFATE-NACL 50-0.9 MG/10ML-% IV SOSY
PREFILLED_SYRINGE | INTRAVENOUS | Status: DC | PRN
  Administered 2019-06-01: 10 mg via INTRAVENOUS
  Administered 2019-06-01: 15 mg via INTRAVENOUS

## 2019-06-01 MED ORDER — FENTANYL CITRATE (PF) 100 MCG/2ML IJ SOLN: 100.0000 ug | Freq: Once | INTRAMUSCULAR | Status: DC

## 2019-06-01 MED ORDER — POLYETHYLENE GLYCOL 3350 17 G PO PACK
17.0000 g | PACK | Freq: Every day | ORAL | Status: DC
  Administered 2019-06-01: 13:00:00 17 g via ORAL
  Filled 2019-06-01: qty 1

## 2019-06-01 MED ORDER — 0.9 % SODIUM CHLORIDE (POUR BTL) OPTIME
TOPICAL | Status: DC | PRN
  Administered 2019-06-01: 1000 mL

## 2019-06-01 MED ORDER — DIPHENHYDRAMINE HCL 12.5 MG/5ML PO ELIX: 50.0000 mg | ORAL_SOLUTION | Freq: Every evening | ORAL | Status: DC | PRN

## 2019-06-01 MED ORDER — VANCOMYCIN HCL IN DEXTROSE 1-5 GM/200ML-% IV SOLN
1000.0000 mg | INTRAVENOUS | Status: AC
  Administered 2019-06-01: 08:00:00 1000 mg via INTRAVENOUS
  Filled 2019-06-01: qty 200

## 2019-06-01 MED ORDER — MUPIROCIN 2 % EX OINT: 1.0000 "application " | TOPICAL_OINTMENT | Freq: Once | CUTANEOUS | Status: AC

## 2019-06-01 MED ORDER — LACTATED RINGERS IV SOLN: INTRAVENOUS | Status: DC | PRN

## 2019-06-01 MED ORDER — ONDANSETRON 4 MG PO TBDP: 4.0000 mg | ORAL_TABLET | Freq: Four times a day (QID) | ORAL | Status: DC | PRN

## 2019-06-01 MED ORDER — PROPOFOL 10 MG/ML IV BOLUS
INTRAVENOUS | Status: DC | PRN
  Administered 2019-06-01: 50 mg via INTRAVENOUS

## 2019-06-01 SURGICAL SUPPLY — 41 items
BENZOIN TINCTURE PRP APPL 2/3 (GAUZE/BANDAGES/DRESSINGS) ×3 IMPLANT
BLADE CLIPPER SURG (BLADE) ×2 IMPLANT
CANISTER SUCT 3000ML PPV (MISCELLANEOUS) ×2 IMPLANT
CHLORAPREP W/TINT 26 (MISCELLANEOUS) ×3 IMPLANT
CLOSURE WOUND 1/2 X4 (GAUZE/BANDAGES/DRESSINGS) ×1
COVER SURGICAL LIGHT HANDLE (MISCELLANEOUS) ×3 IMPLANT
DRAIN PENROSE 1/2X12 LTX STRL (WOUND CARE) ×2 IMPLANT
DRAPE LAPAROTOMY 100X72 PEDS (DRAPES) ×3 IMPLANT
DRAPE LAPAROTOMY TRNSV 102X78 (DRAPES) IMPLANT
DRSG TEGADERM 4X4.75 (GAUZE/BANDAGES/DRESSINGS) ×3 IMPLANT
ELECT REM PT RETURN 9FT ADLT (ELECTROSURGICAL) ×3
ELECTRODE REM PT RTRN 9FT ADLT (ELECTROSURGICAL) ×1 IMPLANT
GAUZE SPONGE 4X4 12PLY STRL (GAUZE/BANDAGES/DRESSINGS) ×3 IMPLANT
GLOVE BIO SURGEON STRL SZ7 (GLOVE) ×4 IMPLANT
GLOVE BIOGEL M STRL SZ7.5 (GLOVE) ×3 IMPLANT
GLOVE BIOGEL PI IND STRL 7.0 (GLOVE) IMPLANT
GLOVE BIOGEL PI INDICATOR 7.0 (GLOVE) ×4
GLOVE INDICATOR 8.0 STRL GRN (GLOVE) ×2 IMPLANT
GLOVE SURG SS PI 6.5 STRL IVOR (GLOVE) ×2 IMPLANT
GOWN STRL REUS W/ TWL LRG LVL3 (GOWN DISPOSABLE) ×1 IMPLANT
GOWN STRL REUS W/TWL 2XL LVL3 (GOWN DISPOSABLE) ×3 IMPLANT
GOWN STRL REUS W/TWL LRG LVL3 (GOWN DISPOSABLE) ×2
KIT BASIN OR (CUSTOM PROCEDURE TRAY) ×3 IMPLANT
KIT TURNOVER KIT B (KITS) ×3 IMPLANT
MESH ULTRAPRO 3X6 7.6X15CM (Mesh General) ×2 IMPLANT
NDL HYPO 25GX1X1/2 BEV (NEEDLE) ×1 IMPLANT
NEEDLE HYPO 25GX1X1/2 BEV (NEEDLE) ×3 IMPLANT
NS IRRIG 1000ML POUR BTL (IV SOLUTION) ×3 IMPLANT
PACK GENERAL/GYN (CUSTOM PROCEDURE TRAY) ×3 IMPLANT
PAD ARMBOARD 7.5X6 YLW CONV (MISCELLANEOUS) ×3 IMPLANT
PENCIL SMOKE EVACUATOR (MISCELLANEOUS) ×3 IMPLANT
STRIP CLOSURE SKIN 1/2X4 (GAUZE/BANDAGES/DRESSINGS) ×2 IMPLANT
SUT MNCRL AB 4-0 PS2 18 (SUTURE) ×3 IMPLANT
SUT PROLENE 2 0 CT2 30 (SUTURE) ×8 IMPLANT
SUT VIC AB 2-0 CT1 36 (SUTURE) IMPLANT
SUT VIC AB 2-0 SH 27 (SUTURE) ×2
SUT VIC AB 2-0 SH 27XBRD (SUTURE) ×1 IMPLANT
SUT VIC AB 3-0 SH 18 (SUTURE) ×3 IMPLANT
SUT VICRYL AB 3 0 TIES (SUTURE) ×3 IMPLANT
SYR CONTROL 10ML LL (SYRINGE) ×3 IMPLANT
TOWEL GREEN STERILE FF (TOWEL DISPOSABLE) ×4 IMPLANT

## 2019-06-01 NOTE — Transfer of Care (Signed)
Immediate Anesthesia Transfer of Care Note  Patient: Clifford Green  Procedure(s) Performed: OPEN REPAIR RIGHT INGUINAL HERNIA WITH MESH (Right Inguinal) Insertion Of Mesh (Right Inguinal)  Patient Location: PACU  Anesthesia Type:GA combined with regional for post-op pain  Level of Consciousness: drowsy and patient cooperative  Airway & Oxygen Therapy: Patient Spontanous Breathing and Patient connected to face mask oxygen  Post-op Assessment: Report given to RN and Post -op Vital signs reviewed and stable  Post vital signs: Reviewed and stable  Last Vitals:  Vitals Value Taken Time  BP    Temp    Pulse    Resp    SpO2      Last Pain:  Vitals:   06/01/19 0646  TempSrc:   PainSc: 0-No pain         Complications: No apparent anesthesia complications

## 2019-06-01 NOTE — Anesthesia Postprocedure Evaluation (Signed)
Anesthesia Post Note  Patient: Clifford Green  Procedure(s) Performed: OPEN REPAIR RIGHT INGUINAL HERNIA WITH MESH (Right Inguinal) Insertion Of Mesh (Right Inguinal)     Patient location during evaluation: PACU Anesthesia Type: Regional and General Level of consciousness: awake Pain management: pain level controlled Vital Signs Assessment: post-procedure vital signs reviewed and stable Respiratory status: spontaneous breathing, nonlabored ventilation, respiratory function stable and patient connected to nasal cannula oxygen Cardiovascular status: blood pressure returned to baseline and stable Postop Assessment: no apparent nausea or vomiting Anesthetic complications: no    Last Vitals:  Vitals:   06/01/19 1238 06/01/19 1239  BP: 99/74   Pulse: 63   Resp: 18 16  Temp: 36.5 C   SpO2: 97%     Last Pain:  Vitals:   06/01/19 1239  TempSrc:   PainSc: 7                  Seidy Labreck P Kayra Crowell

## 2019-06-01 NOTE — Anesthesia Procedure Notes (Signed)
Procedure Name: LMA Insertion Date/Time: 06/01/2019 7:57 AM Performed by: Rosiland Oz, CRNA Pre-anesthesia Checklist: Patient identified, Emergency Drugs available, Suction available, Patient being monitored and Timeout performed Patient Re-evaluated:Patient Re-evaluated prior to induction Oxygen Delivery Method: Circle system utilized Preoxygenation: Pre-oxygenation with 100% oxygen Induction Type: Inhalational induction Ventilation: Mask ventilation without difficulty LMA: LMA inserted LMA Size: 4.0 Number of attempts: 1 Placement Confirmation: positive ETCO2 and breath sounds checked- equal and bilateral Tube secured with: Tape Dental Injury: Teeth and Oropharynx as per pre-operative assessment

## 2019-06-01 NOTE — Anesthesia Procedure Notes (Signed)
Anesthesia Regional Block: TAP block   Pre-Anesthetic Checklist: ,, timeout performed, Correct Patient, Correct Site, Correct Laterality, Correct Procedure, Correct Position, site marked, Risks and benefits discussed,  Surgical consent,  Pre-op evaluation,  At surgeon's request and post-op pain management  Laterality: Right  Prep: chloraprep       Needles:  Injection technique: Single-shot  Needle Type: Echogenic Stimulator Needle     Needle Length: 10cm  Needle Gauge: 21     Additional Needles:   Procedures:,,,, ultrasound used (permanent image in chart),,,,  Narrative:  Start time: 06/01/2019 7:55 AM End time: 06/01/2019 8:05 AM Injection made incrementally with aspirations every 5 mL.  Performed by: Personally  Anesthesiologist: Leonides Grills, MD  Additional Notes: Functioning IV was confirmed and monitors were applied.  A timeout was performed. Sterile prep, hand hygiene and sterile gloves were used. A 21ga Pajunk echogenic stimulator needle was used. Negative aspiration and negative test dose prior to incremental administration of local anesthetic. The patient tolerated the procedure well.  Ultrasound guidance: relevent anatomy identified, needle position confirmed, local anesthetic spread visualized around nerve(s), vascular puncture avoided.  Image printed for medical record.

## 2019-06-01 NOTE — Op Note (Signed)
Clifford Green [id number] 09/19/1955 06/01/2019   Open Right Direct Inguinal Hernia Repair with Mesh Procedure Note  Indications: The patient presented with a history of a right, reducible hernia.    Pre-operative Diagnosis: right reducible inguinal hernia  Post-operative Diagnosis: right direct inguinal hernia  Surgeon: Gaynelle Adu MD FACS  Assistants: none  Anesthesia: General LMA anesthesia and TAP block   Procedure Details  The patient was seen again in the Holding Room. The risks, benefits, complications, treatment options, and expected outcomes were discussed with the patient. The possibilities of reaction to medication, pulmonary aspiration, perforation of viscus, bleeding, recurrent infection, the need for additional procedures, and development of a complication requiring transfusion or further operation were discussed with the patient and/or family. The likelihood of success in repairing the hernia and returning the patient to their previous functional status is good.  There was concurrence with the proposed plan, and informed consent was obtained. The site of surgery was properly noted/marked.  The patient has an extreme needle phobia.  Therefore he received intranasal fentanyl and ketamine in the holding area in order to keep him calm.   The patient was taken to the Operating Room, identified as Clifford Green, and the procedure verified as right inguinal hernia repair. A Time Out was held and the above information confirmed.  Once he was in the operating room. a Peripheral IV was inserted.   The patient was placed in the supine position and underwent induction of anesthesia.  Anesthesia then performed a right tap block using ultrasound.  The lower abdomen and groin was prepped with Chloraprep and draped in the standard fashion, and 0.25% Marcaine with epinephrine was used to anesthetize the skin over the mid-portion of the inguinal canal. An oblique incision was made. Dissection was  carried down through the subcutaneous tissue with cautery to the external oblique fascia.  We opened the external oblique fascia along the direction of its fibers to the external ring.  There appeared to be a cluster of nerves overlying the midportion of the external oblique fascia.  I was able to preserve these nerves and retract them out of the way.  The spermatic cord was circumferentially dissected bluntly and retracted with a Penrose drain.  The floor of the inguinal canal was inspected and there was a direct hernia..  We skeletonized the spermatic cord and no evidence of an indirect sac.  The direct hernia sac was separated from surrounding structures and then reduced back into the abdominal cavity.  I then reinforced the floor in the direct space with 3 interrupted 2-0 Prolene sutures by suturing the medial portion of the conjoined tendon to the most inferior aspect of the shelving edge of the inguinal ligament.  We used a 3 x 6 inch piece of Ultrapro mesh, which was cut into a keyhole shape.  This was secured with 2-0 Prolene, beginning at the pubic tubercle, running this along the shelving edge inferiorly. Superiorly, the mesh was secured to the internal oblique fascia with interrupted 2-0 Prolene sutures.  The tails of the mesh were sutured together behind the spermatic cord.  There was excellent medial coverage over the direct hernia site with mesh.  The mesh was tucked underneath the external oblique fascia laterally.  The external oblique fascia was reapproximated with 2-0 Vicryl.  3-0 Vicryl was used to close the subcutaneous tissues and 4-0 Monocryl was used to close the skin in subcuticular fashion.  Benzoin and steri-strips were used to seal the incision.  A clean  dressing was applied.  The patient was then extubated and brought to the recovery room in stable condition.  All sponge, instrument, and needle counts were correct prior to closure and at the conclusion of the case.   Estimated Blood  Loss: Minimal                 Complications: None; patient tolerated the procedure well.         Disposition: PACU - hemodynamically stable.         Condition: stable  Leighton Ruff. Redmond Pulling, MD, FACS General, Bariatric, & Minimally Invasive Surgery [facility] [place]

## 2019-06-01 NOTE — Interval H&P Note (Signed)
History and Physical Interval Note:  06/01/2019 7:21 AM  Clifford Green  has presented today for surgery, with the diagnosis of RIGHT INGUINAL HERNIA.  The various methods of treatment have been discussed with the patient and family. After consideration of risks, benefits and other options for treatment, the patient has consented to  Procedure(s): OPEN REPAIR RIGHT INGUINAL HERNIA WITH MESH (Right) as a surgical intervention.  The patient's history has been reviewed, patient examined, no change in status, stable for surgery.  I have reviewed the patient's chart and labs.  Questions were answered to the patient's satisfaction.    Mary Sella. Andrey Campanile, MD, FACS General, Bariatric, & Minimally Invasive Surgery [facility]  Gaynelle Adu

## 2019-06-01 NOTE — Plan of Care (Signed)
  Problem: Education: Goal: Knowledge of General Education information will improve Description: Including pain rating scale, medication(s)/side effects and non-pharmacologic comfort measures Outcome: Progressing   Problem: Health Behavior/Discharge Planning: Goal: Ability to manage health-related needs will improve Outcome: Progressing   Problem: Clinical Measurements: Goal: Will remain free from infection Outcome: Progressing   Problem: Activity: Goal: Risk for activity intolerance will decrease Outcome: Progressing   Problem: Nutrition: Goal: Adequate nutrition will be maintained Outcome: Progressing   Problem: Coping: Goal: Level of anxiety will decrease Outcome: Progressing   Problem: Elimination: Goal: Will not experience complications related to bowel motility Outcome: Progressing   Problem: Pain Managment: Goal: General experience of comfort will improve Outcome: Progressing   

## 2019-06-02 DIAGNOSIS — K4090 Unilateral inguinal hernia, without obstruction or gangrene, not specified as recurrent: Secondary | ICD-10-CM | POA: Diagnosis not present

## 2019-06-02 MED ORDER — OXYCODONE HCL 5 MG PO TABS: 5.0000 mg | ORAL_TABLET | Freq: Four times a day (QID) | ORAL | 0 refills | Status: DC | PRN

## 2019-06-02 MED ORDER — ACETAMINOPHEN 500 MG PO TABS
1000.0000 mg | ORAL_TABLET | Freq: Three times a day (TID) | ORAL | 0 refills | Status: AC
Start: 2019-06-02 — End: 2019-06-09

## 2019-06-02 NOTE — Progress Notes (Signed)
Patient ID: Clifford Green, male   DOB: 15-Jul-1955, 64 y.o.   MRN: [id number] Patient seen and wound checked by me.  Discharged by Dr. Andrey Campanile.   He is ready for discharge.    Wenda Low, MD, FACS

## 2019-06-02 NOTE — Plan of Care (Signed)
Ready to be discharged

## 2019-06-02 NOTE — Discharge Instructions (Signed)
[facility]  UMBILICAL OR INGUINAL HERNIA REPAIR: POST OP INSTRUCTIONS  Always review your discharge instruction sheet given to you by the facility where your surgery was performed. IF YOU HAVE DISABILITY OR FAMILY LEAVE FORMS, YOU MUST BRING THEM TO THE OFFICE FOR PROCESSING.   DO NOT GIVE THEM TO YOUR DOCTOR.  1. A  prescription for pain medication may be given to you upon discharge.  Take your pain medication as prescribed, if needed.  If narcotic pain medicine is not needed, then you may take acetaminophen (Tylenol) or ibuprofen (Advil) as needed. 2. Take your usually prescribed medications unless otherwise directed. 3. If you need a refill on your pain medication, please contact your pharmacy.  They will contact our office to request authorization. Prescriptions will not be filled after 5 pm or on week-ends. 4. You should follow a light diet the first 24 hours after arrival home, such as soup and crackers, etc.  Be sure to include lots of fluids daily.  Resume your normal diet the day after surgery. 5. Most patients will experience some swelling and bruising around the umbilicus or in the groin and scrotum.  Ice packs and reclining will help.  Swelling and bruising can take several days to resolve.  6. It is common to experience some constipation if taking pain medication after surgery.  Increasing fluid intake and taking a stool softener (such as Colace) will usually help or prevent this problem from occurring.  A mild laxative (Milk of Magnesia or Miralax) should be taken according to package directions if there are no bowel movements after 48 hours. 7. Unless discharge instructions indicate otherwise, you may remove your bandages 24-48 hours after surgery, and you may shower at that time.  You may have steri-strips (small skin tapes) in place directly over the incision.  These strips should be left on the skin for 7-10 days.  If your surgeon used skin glue on the incision,  you may shower in 24 hours.  The glue will flake off over the next 2-3 weeks.  Any sutures or staples will be removed at the office during your follow-up visit. 8. ACTIVITIES:  You may resume regular (light) daily activities beginning the next day--such as daily self-care, walking, climbing stairs--gradually increasing activities as tolerated.  You may have sexual intercourse when it is comfortable.  Refrain from any heavy lifting or straining until approved by your doctor. a. You may drive when you are no longer taking prescription pain medication, you can comfortably wear a seatbelt, and you can safely maneuver your car and apply brakes. b. RETURN TO WORK:  9. You should see your doctor in the office for a follow-up appointment approximately 2-3 weeks after your surgery.  Make sure that you call for this appointment within a day or two after you arrive home to insure a convenient appointment time. 10. OTHER INSTRUCTIONS: DO NOT LIFT/PUSH/PULL ANYTHING GREATER THAN 10 LBS FOR 6 WEEKS.     WHEN TO CALL YOUR DOCTOR: 1. Fever over 101.0 2. Inability to urinate 3. Nausea and/or vomiting 4. Extreme swelling or bruising 5. Continued bleeding from incision. 6. Increased pain, redness, or drainage from the incision  The clinic staff is available to answer your questions during regular business hours.  Please don't hesitate to call and ask to speak to one of the nurses for clinical concerns.  If you have a medical emergency, go to the nearest emergency room or call 911.  A surgeon from [employer]  [facility] is always on call at the hospital   [address], [place], [place], [place]  [address] ?  [address], [place], [place]   [address] [phone] ? [phone] ? FAX [phone] Web site: [email or web address]  ........Marland Kitchen   Managing Your Pain After Surgery Without Opioids    Thank you for participating in our program to help patients manage their pain after surgery  without opioids. This is part of our effort to provide you with the best care possible, without exposing you or your family to the risk that opioids pose.  What pain can I expect after surgery? You can expect to have some pain after surgery. This is normal. The pain is typically worse the day after surgery, and quickly begins to get better. Many studies have found that many patients are able to manage their pain after surgery with Over-the-Counter (OTC) medications such as Tylenol and Motrin. If you have a condition that does not allow you to take Tylenol or Motrin, notify your surgical team.  How will I manage my pain? The best strategy for controlling your pain after surgery is around the clock pain control with Tylenol (acetaminophen) and Motrin (ibuprofen or Advil). Alternating these medications with each other allows you to maximize your pain control. In addition to Tylenol and Motrin, you can use heating pads or ice packs on your incisions to help reduce your pain.  How will I alternate your regular strength over-the-counter pain medication? You will take a dose of pain medication every three hours. ; Start by taking 650 mg of Tylenol (2 pills of 325 mg) ; 3 hours later take 600 mg of Motrin (3 pills of 200 mg) ; 3 hours after taking the Motrin take 650 mg of Tylenol ; 3 hours after that take 600 mg of Motrin.   - 1 -  See example - if your first dose of Tylenol is at 12:00 PM   12:00 PM Tylenol 650 mg (2 pills of 325 mg)  3:00 PM Motrin 600 mg (3 pills of 200 mg)  6:00 PM Tylenol 650 mg (2 pills of 325 mg)  9:00 PM Motrin 600 mg (3 pills of 200 mg)  Continue alternating every 3 hours   We recommend that you follow this schedule around-the-clock for at least 3 days after surgery, or until you feel that it is no longer needed. Use the table on the last page of this handout to keep track of the medications you are taking. Important: Do not take more than 3000mg  of Tylenol or 1800mg   of Motrin in a 24-hour period. Do not take ibuprofen/Motrin if you have a history of bleeding stomach ulcers, severe kidney disease, &/or actively taking a blood thinner  What if I still have pain? If you have pain that is not controlled with the over-the-counter pain medications (Tylenol and Motrin or Advil) you might have what we call "breakthrough" pain. You will receive a prescription for a small amount of an opioid pain medication such as Oxycodone, Tramadol, or Tylenol with Codeine. Use these opioid pills in the first 24 hours after surgery if you have breakthrough pain. Do not take more than 1 pill every 4-6 hours.  If you still have uncontrolled pain after using all opioid pills, don't hesitate to call our staff using the number provided. We will help make sure you are managing your pain in the best way possible, and if necessary, we can provide a prescription for additional pain medication.  Day 1    Time  Name of Medication Number of pills taken  Amount of Acetaminophen  Pain Level   Comments  AM PM       AM PM       AM PM       AM PM       AM PM       AM PM       AM PM       AM PM       Total Daily amount of Acetaminophen Do not take more than  3,000 mg per day      Day 2    Time  Name of Medication Number of pills taken  Amount of Acetaminophen  Pain Level   Comments  AM PM       AM PM       AM PM       AM PM       AM PM       AM PM       AM PM       AM PM       Total Daily amount of Acetaminophen Do not take more than  3,000 mg per day      Day 3    Time  Name of Medication Number of pills taken  Amount of Acetaminophen  Pain Level   Comments  AM PM       AM PM       AM PM       AM PM          AM PM       AM PM       AM PM       AM PM       Total Daily amount of Acetaminophen Do not take more than  3,000 mg per day      Day 4    Time  Name of Medication Number of pills taken  Amount of Acetaminophen  Pain Level   Comments    AM PM       AM PM       AM PM       AM PM       AM PM       AM PM       AM PM       AM PM       Total Daily amount of Acetaminophen Do not take more than  3,000 mg per day      Day 5    Time  Name of Medication Number of pills taken  Amount of Acetaminophen  Pain Level   Comments  AM PM       AM PM       AM PM       AM PM       AM PM       AM PM       AM PM       AM PM       Total Daily amount of Acetaminophen Do not take more than  3,000 mg per day       Day 6    Time  Name of Medication Number of pills taken  Amount of Acetaminophen  Pain Level  Comments  AM PM       AM PM       AM PM       AM  PM       AM PM       AM PM       AM PM       AM PM       Total Daily amount of Acetaminophen Do not take more than  3,000 mg per day      Day 7    Time  Name of Medication Number of pills taken  Amount of Acetaminophen  Pain Level   Comments  AM PM       AM PM       AM PM       AM PM       AM PM       AM PM       AM PM       AM PM       Total Daily amount of Acetaminophen Do not take more than  3,000 mg per day        For additional information about how and where to safely dispose of unused opioid medications - [employer]  Disclaimer: This document contains information and/or instructional materials adapted from [place] for the typical patient with your condition. It does not replace medical advice from your health care provider because your experience may differ from that of the typical patient. Talk to your health care provider if you have any questions about this document, your condition or your treatment plan. Adapted from [place]

## 2019-06-05 NOTE — Discharge Summary (Signed)
Physician Discharge Summary  Clifford Green VCB:[id number] DOB: 28-Jan-1956 DOA: 06/01/2019  PCP: Patient, No Pcp Per  Admit date: 06/01/2019 Discharge date: 06/02/2019  Recommendations for Outpatient Follow-up:    Follow-up Information    Gaynelle Adu, MD. Schedule an appointment as soon as possible for a visit in 4 week(s).   Specialty: General Surgery Why: for postop check Contact information: [address] [place] [address] [phone]          Discharge Diagnoses:  1. Right direct inguinal hernia s/p repair 2. Extreme needle phobia  Surgical Procedure: open repair of right direct inguinal hernia with mesh  Discharge Condition: good Disposition: home  Diet recommendation: regular  Filed Weights   06/01/19 0555  Weight: 49.9 kg    History of present illness:  Patient presented for elective repair of right inguinal hernia  Hospital Course:  Underwent elective repair of right direct inguinal hernia with mesh.  He was kept overnight for observation mainly for social reasons since he lives with his 83 year old mother and had no assistance at home.  On postop day 1 he was doing well.  Dr. Daphine Deutscher felt he was safe for discharge.  He had voided.  He was ambulating.  His pain was well controlled.   Discharge Instructions  Discharge Instructions    Call MD for:   Complete by: As directed    Temperature >101   Call MD for:  hives   Complete by: As directed    Call MD for:  persistant dizziness or light-headedness   Complete by: As directed    Call MD for:  persistant nausea and vomiting   Complete by: As directed    Call MD for:  redness, tenderness, or signs of infection (pain, swelling, redness, odor or green/yellow discharge around incision site)   Complete by: As directed    Call MD for:  severe uncontrolled pain   Complete by: As directed    Diet - low sodium heart healthy   Complete by: As directed    Discharge instructions   Complete by: As  directed    See [facility] discharge instructions   Increase activity slowly   Complete by: As directed      Allergies as of 06/02/2019      Reactions   Codeine Other (See Comments)   Mood altering   Penicillins Rash   Did it involve swelling of the face/tongue/throat, SOB, or low BP? No Did it involve sudden or severe rash/hives, skin peeling, or any reaction on the inside of your mouth or nose? Yes Did you need to seek medical attention at a hospital or doctor's office? Yes When did it last happen?childhood If all above answers are "NO", may proceed with cephalosporin use.   Sulfa Antibiotics Rash      Medication List    STOP taking these medications   docusate sodium 100 MG capsule Commonly known as: COLACE     TAKE these medications   acetaminophen 500 MG tablet Commonly known as: TYLENOL Take 2 tablets (1,000 mg total) by mouth every 8 (eight) hours for 7 days.   cetirizine 10 MG tablet Commonly known as: ZYRTEC Take 10 mg by mouth at bedtime.   omeprazole 20 MG capsule Commonly known as: PRILOSEC Take 20 mg by mouth daily before supper.   oxyCODONE 5 MG immediate release tablet Commonly known as: Oxy IR/ROXICODONE Take 1 tablet (5 mg total) by mouth every 6 (six) hours as needed for breakthrough pain. What  changed: reasons to take this   polyethylene glycol 17 g packet Commonly known as: MIRALAX / GLYCOLAX Take 17 g by mouth daily.   VISINE OP Place 1 drop into both eyes 3 (three) times daily as needed (redness/irritation).   ZzzQuil 50 MG/30ML Liqd Generic drug: diphenhydrAMINE HCl Take 50 mg by mouth at bedtime as needed (sleep).      Follow-up Information    Greer Pickerel, MD. Schedule an appointment as soon as possible for a visit in 4 week(s).   Specialty: General Surgery Why: for postop check Contact information: [address] [place] [address] [phone]            The results of significant diagnostics from this  hospitalization (including imaging, microbiology, ancillary and laboratory) are listed below for reference.    Significant Diagnostic Studies: No results found.  Microbiology: Recent Results (from the past 240 hour(s))  SARS CORONAVIRUS 2 (TAT 6-24 HRS) Nasopharyngeal Nasopharyngeal Swab     Status: None   Collection Time: 05/29/19  9:34 AM   Specimen: Nasopharyngeal Swab  Result Value Ref Range Status   SARS Coronavirus 2 NEGATIVE NEGATIVE Final    Comment: (NOTE) SARS-CoV-2 target nucleic acids are NOT DETECTED. The SARS-CoV-2 RNA is generally detectable in upper and lower respiratory specimens during the acute phase of infection. Negative results do not preclude SARS-CoV-2 infection, do not rule out co-infections with other pathogens, and should not be used as the sole basis for treatment or other patient management decisions. Negative results must be combined with clinical observations, patient history, and epidemiological information. The expected result is Negative. Fact Sheet for Patients: SugarRoll.be Fact Sheet for Healthcare Providers: https://www.woods-mathews.com/ This test is not yet approved or cleared by the [place] FDA and  has been authorized for detection and/or diagnosis of SARS-CoV-2 by FDA under an Emergency Use Authorization (EUA). This EUA will remain  in effect (meaning this test can be used) for the duration of the COVID-19 declaration under Section 56 4(b)(1) of the Act, 21 U.S.C. section 360bbb-3(b)(1), unless the authorization is terminated or revoked sooner. Performed at [facility], [place] [address] [place], [place] [address]   Surgical pcr screen     Status: None   Collection Time: 06/01/19  6:29 AM   Specimen: Nasal Mucosa; Nasal Swab  Result Value Ref Range Status   MRSA, PCR NEGATIVE NEGATIVE Final   Staphylococcus aureus NEGATIVE NEGATIVE Final    Comment: (NOTE) The Xpert SA Assay (FDA  approved for NASAL specimens in patients 68 years of age and older), is one component of a comprehensive surveillance program. It is not intended to diagnose infection nor to guide or monitor treatment. Performed at [facility], [place] [address], [place], [place] [address]      Labs: Basic Metabolic Panel: No results for input(s): NA, K, CL, CO2, GLUCOSE, BUN, CREATININE, CALCIUM, MG, PHOS in the last 168 hours. Liver Function Tests: No results for input(s): AST, ALT, ALKPHOS, BILITOT, PROT, ALBUMIN in the last 168 hours. No results for input(s): LIPASE, AMYLASE in the last 168 hours. No results for input(s): AMMONIA in the last 168 hours. CBC: No results for input(s): WBC, NEUTROABS, HGB, HCT, MCV, PLT in the last 168 hours. Cardiac Enzymes: No results for input(s): CKTOTAL, CKMB, CKMBINDEX, TROPONINI in the last 168 hours. BNP: BNP (last 3 results) No results for input(s): BNP in the last 8760 hours.  ProBNP (last 3 results) No results for input(s): PROBNP  in the last 8760 hours.  CBG: No results for input(s): GLUCAP in the last 168 hours.  Active Problems:   S/P inguinal hernia repair   Time coordinating discharge: 10 min  Signed:  Atilano Ina, MD [facility], [place] [phone] 06/05/2019, 7:29 AM

## 2020-08-06 ENCOUNTER — Other Ambulatory Visit: Payer: Self-pay | Admitting: Gastroenterology

## 2020-08-06 DIAGNOSIS — R634 Abnormal weight loss: Secondary | ICD-10-CM

## 2020-08-20 ENCOUNTER — Other Ambulatory Visit: Payer: BC Managed Care – PPO

## 2020-09-01 ENCOUNTER — Other Ambulatory Visit: Payer: Self-pay

## 2020-09-01 ENCOUNTER — Ambulatory Visit
Admission: RE | Admit: 2020-09-01 | Discharge: 2020-09-01 | Disposition: A | Discharge status: Home / Self Care | Payer: BC Managed Care – PPO | Source: Ambulatory Visit | Attending: Gastroenterology | Admitting: Gastroenterology

## 2020-09-01 ENCOUNTER — Other Ambulatory Visit: Payer: Self-pay | Admitting: Gastroenterology

## 2020-09-01 DIAGNOSIS — R634 Abnormal weight loss: Secondary | ICD-10-CM

## 2020-10-28 ENCOUNTER — Other Ambulatory Visit: Payer: Self-pay

## 2020-10-28 ENCOUNTER — Ambulatory Visit: Payer: BC Managed Care – PPO | Admitting: Allergy & Immunology

## 2020-10-28 ENCOUNTER — Encounter: Payer: Self-pay | Admitting: Allergy & Immunology

## 2020-10-28 VITALS — BP 118/78 | HR 78 | Temp 98.6°F | Resp 16 | Ht 66.5 in | Wt 108.8 lb

## 2020-10-28 DIAGNOSIS — T783XXD Angioneurotic edema, subsequent encounter: Secondary | ICD-10-CM

## 2020-10-28 DIAGNOSIS — T781XXD Other adverse food reactions, not elsewhere classified, subsequent encounter: Secondary | ICD-10-CM

## 2020-10-28 DIAGNOSIS — K9049 Malabsorption due to intolerance, not elsewhere classified: Secondary | ICD-10-CM

## 2020-10-28 NOTE — Progress Notes (Signed)
NEW PATIENT  Date of Service/Encounter:  10/28/20  Consult requested by: Patient, No Pcp Per (Inactive)   Assessment:   Angioedema - unclear trigger (LABS ORDERED)  Food intolerance - with negative testing to the entire panel  Weight loss  IBS versus Crohns  Plan/Recommendations:   1. Angioedema - unknown trigger - We are going to get some labs to look for serious causes of swelling.  - We will call you in 1-2 weeks with the results of the testing.  2. Food intolerance - Testing was negative to the entire panel. - There is a the low positive predictive value of food allergy testing and hence the high possibility of false positives. - In contrast, food allergy testing has a high negative predictive value, therefore if testing is negative we can be relatively assured that they are indeed negative.  - I do not think that this is a food allergy. - I would try to increase your diet as tolerated. - We are going to refer you to see a Registered Dietician.   3. Return in about 6 weeks (around 12/09/2020).   This note in its entirety was forwarded to the Provider who requested this consultation.  Subjective:   Clifford Green is a 65 y.o. male presenting today for evaluation of  Chief Complaint  Patient presents with   Allergic Reaction    Clifford Green has a history of the following: Patient Active Problem List   Diagnosis Date Noted   S/P inguinal hernia repair 06/01/2019   Incarcerated hernia 04/05/2019    History obtained from: chart review and patient.  Clifford Green was referred by Patient, No Pcp Per (Inactive).     Clifford Green is a 65 y.o. male presenting for an evaluation of food allergy testing .  He has a rather cannulated story that he thinks started when he got COVID-19 in December 2019. This seems to have gotten worse when he got COVID19. This was December 2019. He had emergency hernia surgery in February 2021. He had the right hernia done in April 2021. It  accelerated again after this. At this point, he never realized the cause of it.  He felt it might be related to fruit or vegetables, but it was never consistent.  He had a CT scan in February 2021 that showed possible inflammatory or infectious wall thickening in the jejunum.  He has facial swelling that tends to occur mostly at night.  He reports that the facial swelling is hot and tingling and itchy. Unsure if this is parotitis or not. It cna be mostly the right side, but sometimes the left side. He never got an ultrasound of the swelling.  He endorses a 30+ pound weight loss.    A more recent abdomen pelvis CT demonstrates no evidence of urolithiasis hydronephrosis or acute findings.  He has been using cetirizine daily to suppress the symptoms. He uses Benadryl when it happens. He has never been sick until the last two years.  He is unsure whether this is related to food, but it only shows up when he goes to sleep. He only eats chicken, rice, potatoes, and sweets. He seems to have facial swelling from a number of foods and he has therefore limits his diet severely. He does not eat red meat because it "stays in his system so long".   He has been placed on Linzess for presumed IBS. However, he also talks about a possible Crohn's diagnosis. He has some "attacks" from "sticking [his] finger  down his throat". He sticks his finger down his throat to "keep from dying". It reports that he has to throw up 40+ times before throwing up the acid. This is daily.   He is struggling and only able to work 4 hours per day. He has been having endoscopies and colonoscopies and a bunch of labs performed.   Otherwise, there is no history of other atopic diseases, including drug allergies, stinging insect allergies, eczema, urticaria, or contact dermatitis. There is no significant infectious history. Vaccinations are up to date.    Past Medical History: Patient Active Problem List   Diagnosis Date Noted   S/P  inguinal hernia repair 06/01/2019   Incarcerated hernia 04/05/2019    Medication List:  Allergies as of 10/28/2020       Reactions   Codeine Other (See Comments)   Mood altering   Penicillins Rash   Did it involve swelling of the face/tongue/throat, SOB, or low BP? No Did it involve sudden or severe rash/hives, skin peeling, or any reaction on the inside of your mouth or nose? Yes Did you need to seek medical attention at a hospital or doctor's office? Yes When did it last happen?  childhood     If all above answers are "NO", may proceed with cephalosporin use.   Sulfa Antibiotics Rash        Medication List        Accurate as of October 28, 2020 11:59 PM. If you have any questions, ask your nurse or doctor.          cetirizine 10 MG tablet Commonly known as: ZYRTEC Take 10 mg by mouth at bedtime.   hydrocortisone 2.5 % rectal cream Commonly known as: ANUSOL-HC Apply topically 2 (two) times daily.   Linzess 145 MCG Caps capsule Generic drug: linaclotide Take 145 mcg by mouth every morning.   omeprazole 20 MG capsule Commonly known as: PRILOSEC Take 20 mg by mouth daily before supper.   omeprazole 40 MG capsule Commonly known as: PRILOSEC Take 40 mg by mouth every morning.   oxyCODONE 5 MG immediate release tablet Commonly known as: Oxy IR/ROXICODONE Take 1 tablet (5 mg total) by mouth every 6 (six) hours as needed for breakthrough pain.   polyethylene glycol 17 g packet Commonly known as: MIRALAX / GLYCOLAX Take 17 g by mouth daily.   VISINE OP Place 1 drop into both eyes 3 (three) times daily as needed (redness/irritation).   ZzzQuil 50 MG/30ML Liqd Generic drug: diphenhydrAMINE HCl Take 50 mg by mouth at bedtime as needed (sleep).        Birth History: non-contributory  Developmental History: non-contributory  Past Surgical History: Past Surgical History:  Procedure Laterality Date   INGUINAL HERNIA REPAIR Left 04/06/2019   Procedure:  LEFT INCARCERATED INGUINAL HERNIA REPAIR WITH MESH;  Surgeon: Gaynelle Adu, MD;  Location: [facility] OR;  Service: General;  Laterality: Left;   INGUINAL HERNIA REPAIR Right 06/01/2019   Procedure: OPEN REPAIR RIGHT INGUINAL HERNIA WITH MESH;  Surgeon: Gaynelle Adu, MD;  Location: [facility] OR;  Service: General;  Laterality: Right;   INSERTION OF MESH Right 06/01/2019   Procedure: Insertion Of Mesh;  Surgeon: Gaynelle Adu, MD;  Location: [facility] OR;  Service: General;  Laterality: Right;     Family History: History reviewed. No pertinent family history.   Social History: Clifford Green lives at home in a townhome that is [address] years old.  There is carpeting throughout the home.  They have gas heating and central  cooling.  There are dust mite covers on the bed, but not the pillows.  There is tobacco exposure in the car as well as the house.  He is currently in sales for the past 5 years.  He is not exposed to fumes, chemicals, or dust.  He does not use a HEPA filter.  He does not live near an interstate or industrial area.   Review of Systems  Constitutional: Negative.  Negative for fever, malaise/fatigue and weight loss.  HENT: Negative.  Negative for congestion, ear discharge and ear pain.   Eyes:  Negative for pain, discharge and redness.  Respiratory:  Negative for cough, sputum production, shortness of breath and wheezing.   Cardiovascular: Negative.  Negative for chest pain and palpitations.  Gastrointestinal:  Negative for abdominal pain and heartburn.  Skin: Negative.  Negative for itching and rash.  Neurological:  Negative for dizziness and headaches.  Endo/Heme/Allergies:  Negative for environmental allergies. Does not bruise/bleed easily.      Objective:   Blood pressure 118/78, pulse 78, temperature 98.6 F (37 C), temperature source Temporal, resp. rate 16, height 5' 6.5" (1.689 m), weight 108 lb 12.8 oz (49.4 kg), SpO2 96 %. Body mass index is 17.3 kg/m.   Physical Exam:   Physical  Exam Constitutional:      Appearance: He is cachectic.  HENT:     Head: Normocephalic and atraumatic.     Right Ear: Tympanic membrane, ear canal and external ear normal. No drainage, swelling or tenderness. Tympanic membrane is not injected, scarred, erythematous, retracted or bulging.     Left Ear: Tympanic membrane, ear canal and external ear normal. No drainage, swelling or tenderness. Tympanic membrane is not injected, scarred, erythematous, retracted or bulging.     Nose: No nasal deformity, septal deviation, mucosal edema or rhinorrhea.     Right Sinus: No maxillary sinus tenderness or frontal sinus tenderness.     Left Sinus: No maxillary sinus tenderness or frontal sinus tenderness.     Mouth/Throat:     Mouth: Mucous membranes are not pale and not dry.     Pharynx: Uvula midline.  Eyes:     General:        Right eye: No discharge.        Left eye: No discharge.     Conjunctiva/sclera: Conjunctivae normal.     Right eye: Right conjunctiva is not injected. No chemosis.    Left eye: Left conjunctiva is not injected. No chemosis.    Pupils: Pupils are equal, round, and reactive to light.  Cardiovascular:     Rate and Rhythm: Normal rate and regular rhythm.     Heart sounds: Normal heart sounds.  Pulmonary:     Effort: Pulmonary effort is normal. No tachypnea, accessory muscle usage or respiratory distress.     Breath sounds: Normal breath sounds. No wheezing, rhonchi or rales.  Chest:     Chest wall: No tenderness.  Abdominal:     Tenderness: There is no abdominal tenderness. There is no guarding or rebound.  Lymphadenopathy:     Head:     Right side of head: No submandibular, tonsillar or occipital adenopathy.     Left side of head: No submandibular, tonsillar or occipital adenopathy.     Cervical: No cervical adenopathy.  Skin:    Coloration: Skin is not pale.     Findings: No abrasion, erythema, petechiae or rash. Rash is not papular, urticarial or vesicular.   Neurological:  Mental Status: He is alert.     Diagnostic studies:   Allergy Studies:     Food Adult Perc - 10/28/20 0900      Control-buffer 50% Glycerol NEGATIVE   Control-Histamine 1 mg/ml 2 +   1. Peanut NEGATIVE   2. Soybean NEGATIVE   3. Wheat NEGATIVE   4. Sesame NEGATIVE   5. Milk, cow NEGATIVE   6. Egg White, Chicken NEGATIVE   7. Casein NEGATIVE   8. Shellfish Mix NEGATIVE   9. Fish Mix NEGATIVE   10. Cashew NEGATIVE   11. Pecan Food NEGATIVE   12. Walnut Food NEGATIVE   13. Almond NEGATIVE   14. Hazelnut NEGATIVE   15. [place] nut NEGATIVE   16. Coconut NEGATIVE   17. Pistachio NEGATIVE   18. Catfish NEGATIVE   19. Bass NEGATIVE   20. Trout NEGATIVE   21. Tuna NEGATIVE   22. Salmon NEGATIVE   23. Flounder NEGATIVE   24. Codfish NEGATIVE   25. Shrimp NEGATIVE   26. Crab NEGATIVE   27. Lobster NEGATIVE   28. Oyster NEGATIVE   29. Scallops NEGATIVE   30. Barley NEGATIVE   31. Oat  NEGATIVE   32. Rye  NEGATIVE   33. Hops NEGATIVE   34. Rice NEGATIVE   35. Cottonseed NEGATIVE   36. Saccharomyces Cerevisiae  NEGATIVE   37. Pork NEGATIVE   38. [place] Meat NEGATIVE   39. Chicken Meat NEGATIVE   40. Beef NEGATIVE   41. Lamb NEGATIVE   42. Tomato NEGATIVE   43. White Potato NEGATIVE   44. Sweet Potato NEGATIVE   45. Pea, Green/English NEGATIVE   46. Navy Bean NEGATIVE   47. Mushrooms NEGATIVE   48. Avocado NEGATIVE   49. Onion NEGATIVE   50. Cabbage NEGATIVE   51. Carrots NEGATIVE   52. Celery NEGATIVE   53. Corn NEGATIVE   54. Cucumber NEGATIVE   55. Grape (White seedless) NEGATIVE   56. Orange  NEGATIVE   57. Banana NEGATIVE   58. Apple NEGATIVE   59. Peach NEGATIVE   60. Strawberry NEGATIVE   61. Cantaloupe NEGATIVE   62. Watermelon NEGATIVE   63. Pineapple NEGATIVE   64. Chocolate/Cacao bean NEGATIVE   65. Karaya Gum NEGATIVE   66. Acacia (Arabic Gum) NEGATIVE   67. Cinnamon NEGATIVE   68. Nutmeg NEGATIVE   69. Ginger NEGATIVE    70. Garlic NEGATIVE   71. Pepper, black NEGATIVE   72. Mustard NEGATIVE            Allergy testing results were read and interpreted by myself, documented by clinical staff.         Malachi Bonds, MD [facility]

## 2020-10-28 NOTE — Patient Instructions (Addendum)
1. Angioedema - unknown trigger - We are going to get some labs to look for serious causes of swelling.  - We will call you in 1-2 weeks with the results of the testing.  2. Food intolerance - Testing was negative to the entire panel. - There is a the low positive predictive value of food allergy testing and hence the high possibility of false positives. - In contrast, food allergy testing has a high negative predictive value, therefore if testing is negative we can be relatively assured that they are indeed negative.  - I do not think that this is a food allergy. - I would try to increase your diet as tolerated. - We are going to refer you to see a Registered Dietician.   3. Return in about 6 weeks (around 12/09/2020).    Please inform us of any Emergency Department visits, hospitalizations, or changes in symptoms. Call us before going to the ED for breathing or allergy symptoms since we might be able to fit you in for a sick visit. Feel free to contact us anytime with any questions, problems, or concerns.  It was a pleasure to meet you today!  Websites that have reliable patient information: 1. American Academy of Asthma, Allergy, and Immunology: www.aaaai.org 2. Food Allergy Research and Education (FARE): foodallergy.org 3. Mothers of Asthmatics: http://www.asthmacommunitynetwork.org 4. American College of Allergy, Asthma, and Immunology: www.acaai.org   COVID-19 Vaccine Information can be found at: [email or web address].[email or web address] For questions related to vaccine distribution or appointments, please email [email or web address] or call [phone].   We realize that you might be concerned about having an allergic reaction to the COVID19 vaccines. To help with that concern, WE ARE OFFERING THE COVID19 VACCINES IN OUR OFFICE! Ask the front desk for dates!     "Like" [place] on Facebook and Instagram for our latest updates!      A healthy  democracy works best when [employer] participate! Make sure you are registered to vote! If you have moved or changed any of your contact information, you will need to get this updated before voting!  In some cases, you MAY be able to register to vote online: [email or web address]    1. Peanut Negative   2. Soybean Negative   3. Wheat Negative   4. Sesame Negative   5. Milk, cow Negative   6. Egg White, Chicken Negative   7. Casein Negative   8. Shellfish Mix Negative   9. Fish Mix Negative   10. Cashew Negative   11. Pecan Food Negative   12. Walnut Food Negative   13. Almond Negative   14. Hazelnut Negative   15. [place] nut Negative   16. Coconut Negative   17. Pistachio Negative   18. Catfish Negative   19. Bass Negative   20. Trout Negative   21. Tuna Negative   22. Salmon Negative   23. Flounder Negative   24. Codfish Negative   25. Shrimp Negative   26. Crab Negative   27. Lobster Negative   28. Oyster Negative   29. Scallops Negative   30. Barley Negative   31. Oat  Negative   32. Rye  Negative   33. Hops Negative   34. Rice Negative   35. Cottonseed Negative   36. Saccharomyces Cerevisiae  Negative   37. Pork Negative   38. [place] Meat Negative   39. Chicken Meat Negative   40. Beef Negative   41. Lamb Negative   42. Tomato Negative   43. White  Potato Negative   44. Sweet Potato Negative   45. Pea, Green/English Negative   46. Navy Bean Negative   47. Mushrooms Negative   48. Avocado Negative   49. Onion Negative   50. Cabbage Negative   51. Carrots Negative   52. Celery Negative   53. Corn Negative   54. Cucumber Negative   55. Grape (White seedless) Negative   56. Orange  Negative   57. Banana Negative   58. Apple Negative   59. Peach Negative   60. Strawberry Negative   61. Cantaloupe Negative   62. Watermelon Negative   63. Pineapple Negative   64. Chocolate/Cacao bean Negative   65. Karaya Gum Negative   66.  Acacia (Arabic Gum) Negative   67. Cinnamon Negative   68. Nutmeg Negative   69. Ginger Negative   70. Garlic Negative   71. Pepper, black Negative   72. Mustard Negative

## 2020-10-29 ENCOUNTER — Encounter: Payer: Self-pay | Admitting: Allergy & Immunology

## 2020-12-09 ENCOUNTER — Ambulatory Visit: Payer: BC Managed Care – PPO | Admitting: Allergy & Immunology

## 2020-12-09 DIAGNOSIS — J309 Allergic rhinitis, unspecified: Secondary | ICD-10-CM

## 2021-01-08 ENCOUNTER — Other Ambulatory Visit: Payer: Self-pay | Admitting: Gastroenterology

## 2021-01-08 ENCOUNTER — Other Ambulatory Visit (HOSPITAL_COMMUNITY): Payer: Self-pay | Admitting: Gastroenterology

## 2021-01-08 DIAGNOSIS — R112 Nausea with vomiting, unspecified: Secondary | ICD-10-CM

## 2021-01-20 ENCOUNTER — Other Ambulatory Visit: Payer: Self-pay

## 2021-01-20 ENCOUNTER — Ambulatory Visit (HOSPITAL_COMMUNITY)
Admission: RE | Admit: 2021-01-20 | Discharge: 2021-01-20 | Disposition: A | Discharge status: Home / Self Care | Payer: BC Managed Care – PPO | Source: Ambulatory Visit | Attending: Gastroenterology | Admitting: Gastroenterology

## 2021-01-20 ENCOUNTER — Encounter (HOSPITAL_COMMUNITY): Payer: Self-pay

## 2021-01-20 DIAGNOSIS — R112 Nausea with vomiting, unspecified: Secondary | ICD-10-CM

## 2022-03-27 IMAGING — CT CT ABD-PELV W/O CM
2 of 4 series · 12 of 46 positions shown, 14 images · non-contrast
Comparison: 04/05/2019

CLINICAL DATA: 18 lb weight loss in 1 year. Abdominal pain for 2
weeks. Constipation.

EXAM:
CT ABDOMEN AND PELVIS WITHOUT CONTRAST
TECHNIQUE: Multidetector CT imaging of the abdomen and pelvis was performed
following the standard protocol without IV contrast.

[Series 2: routine abdomen pelvis without 5.00 br40 s3 axial · axial · non-contrast · 0.42mm/px · z∈[+1267,+1607]mm · 9 of 82 slices shown, 11 images]
[im 7/82  soft-tissue]
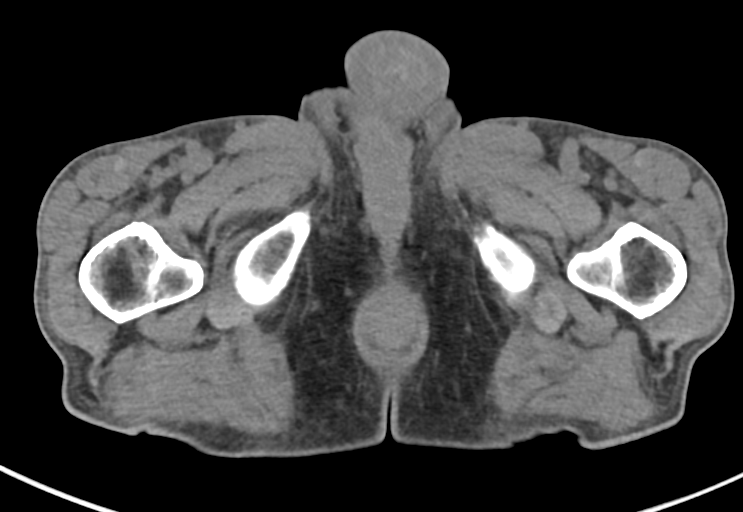
[im 7/82  bone]
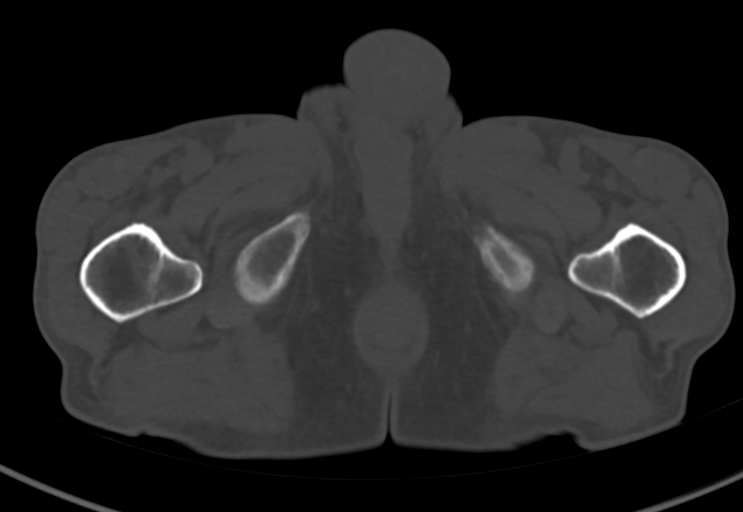
[im 14/82  soft-tissue]
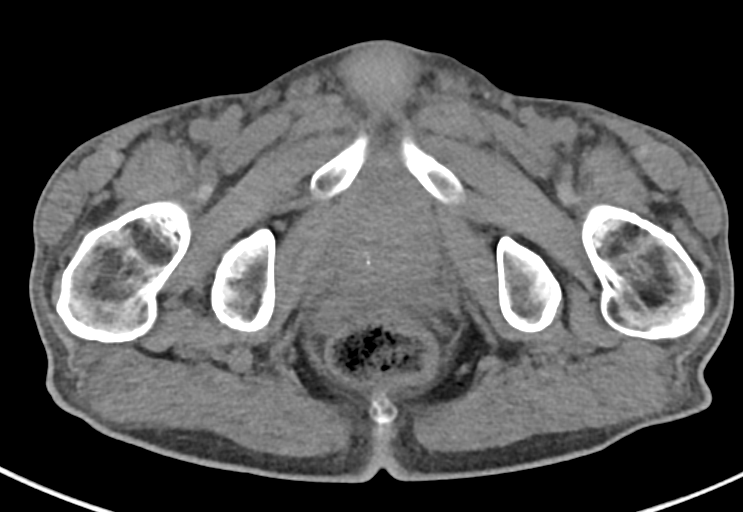
[im 24/82  soft-tissue]
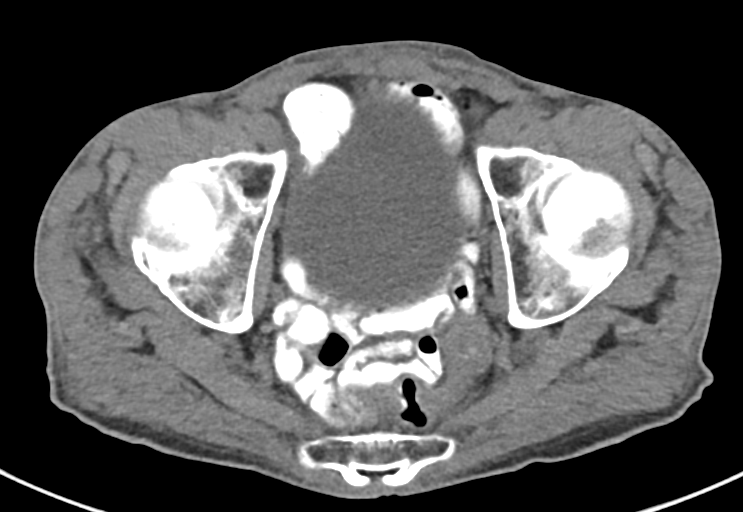
[im 31/82  soft-tissue]
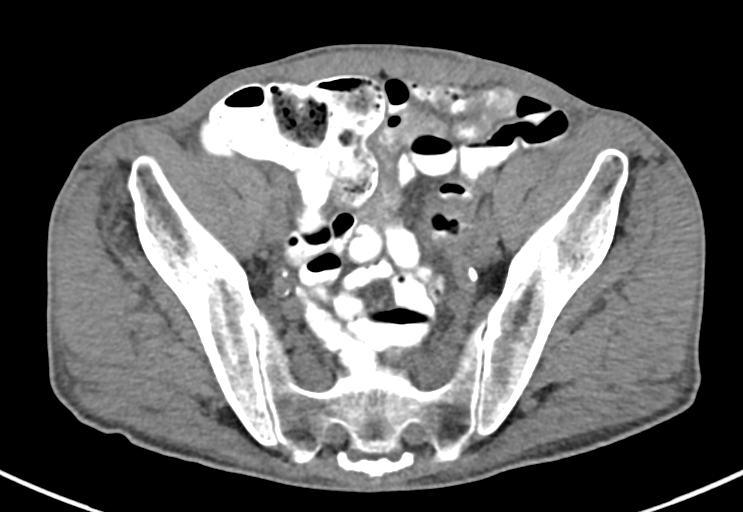
[im 41/82  soft-tissue]
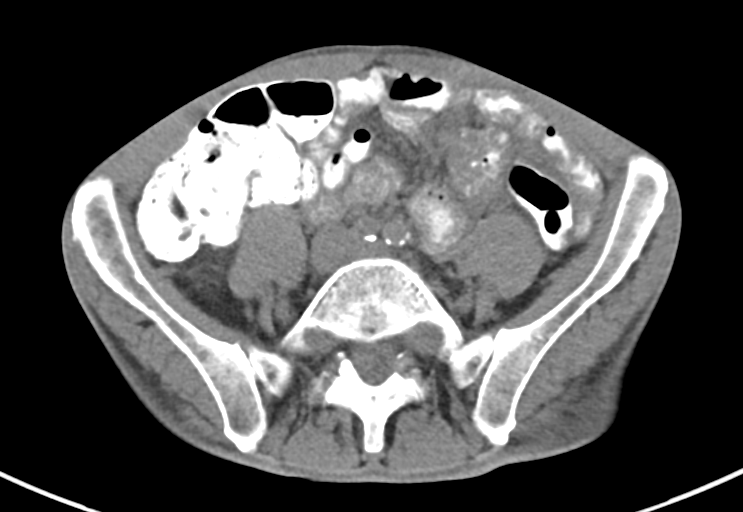
[im 51/82  soft-tissue]
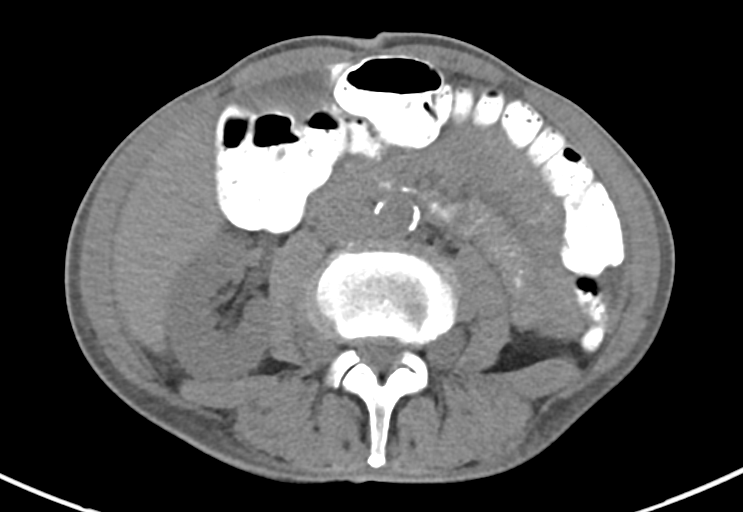
[im 58/82  soft-tissue]
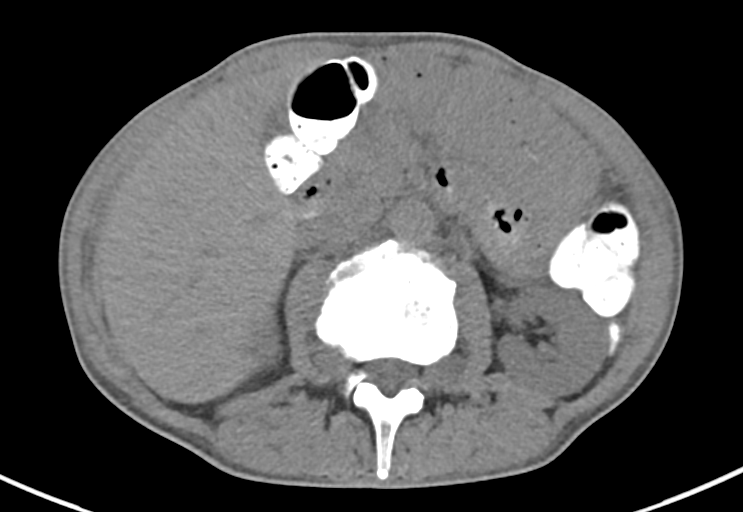
[im 68/82  soft-tissue]
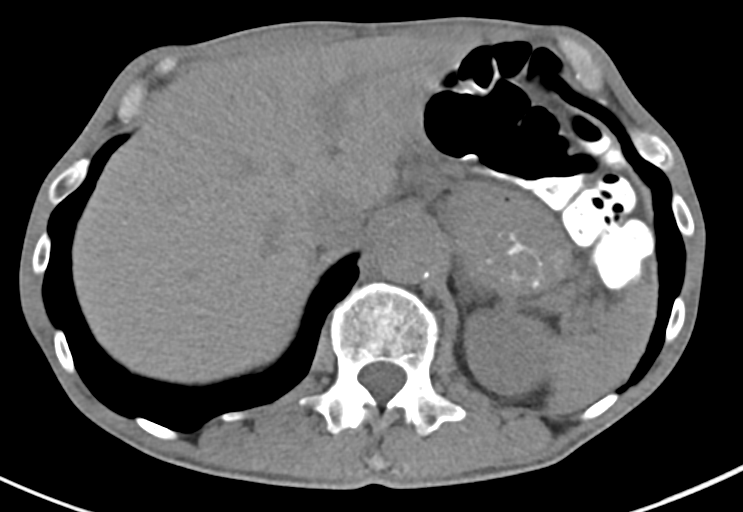
[im 75/82  soft-tissue]
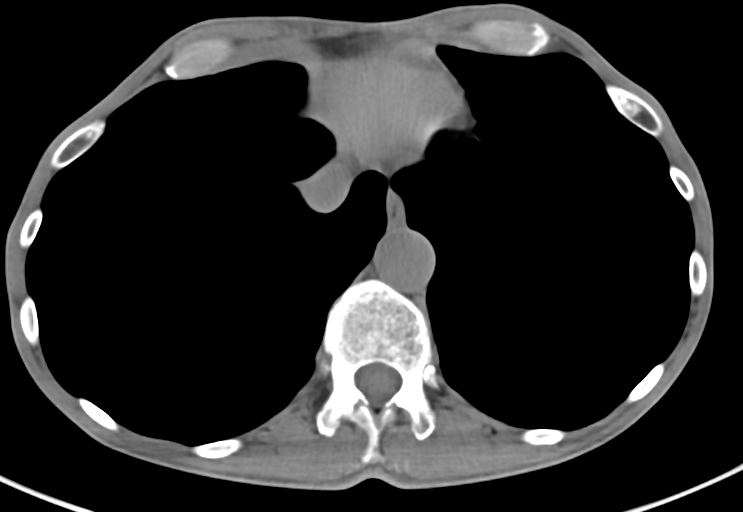
[im 75/82  bone]
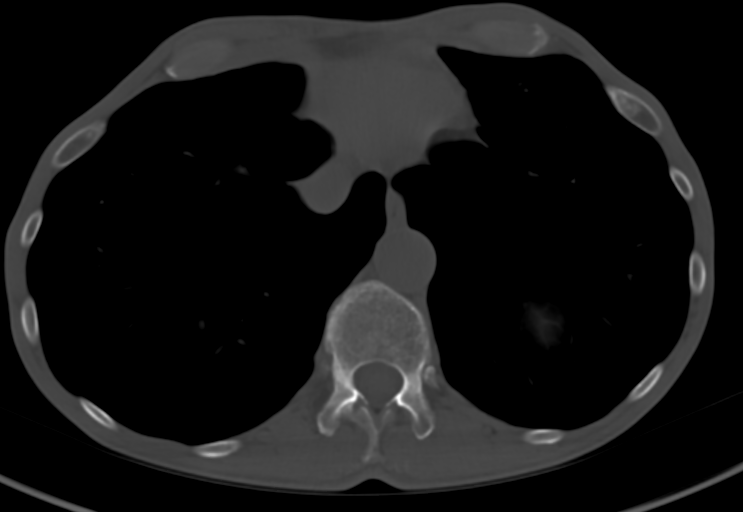

[Series 4: routine abdomen pelvis without 2.00 br40 s3 cor · coronal · non-contrast · 0.61mm/px · 3 of 108 slices shown]
[im 36/108  soft-tissue]
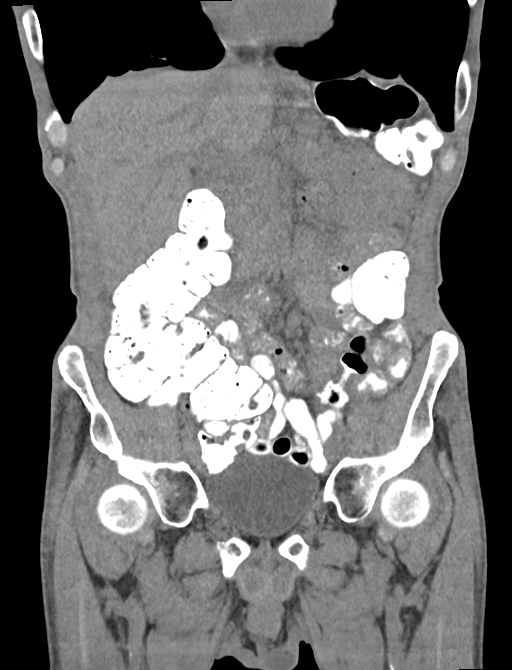
[im 48/108  soft-tissue]
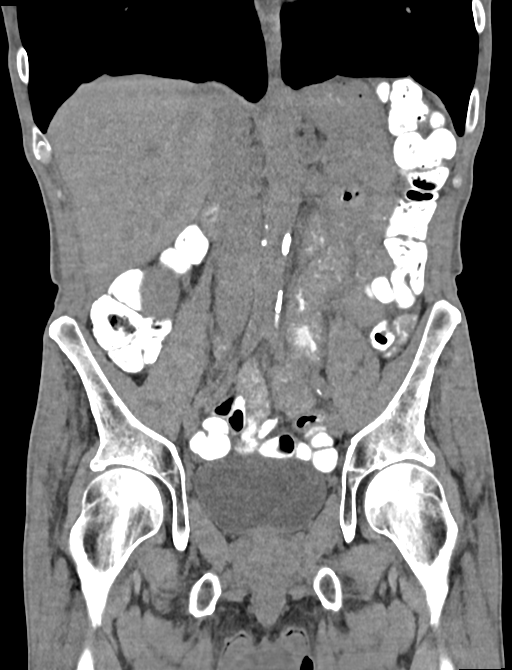
[im 60/108  soft-tissue]
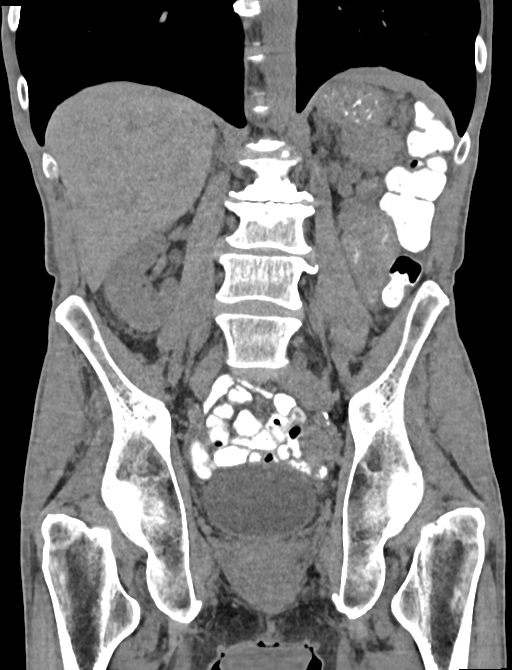

[12 of 46 positions shown; findings below may reference images not displayed]

FINDINGS: Lower chest: No acute findings.

Hepatobiliary: No mass visualized on this unenhanced exam.
Gallbladder is unremarkable. No evidence of biliary ductal
dilatation.

Pancreas: No mass or inflammatory process visualized on this
unenhanced exam.

Spleen:  Within normal limits in size.

Adrenals/Urinary tract: No evidence of urolithiasis or
hydronephrosis. Unremarkable unopacified urinary bladder.

Stomach/Bowel: No evidence of obstruction, inflammatory process, or
abnormal fluid collections.

Vascular/Lymphatic: No pathologically enlarged lymph nodes
identified. No evidence of abdominal aortic aneurysm. Aortic
atherosclerotic calcification noted.

Reproductive:  No mass or other significant abnormality.

Other:  None.

Musculoskeletal: No suspicious bone lesions identified. Bilateral L5
pars defects are again noted, however there is no evidence of
associated spondylolisthesis. Severe degenerative disc disease again
seen at L2-3.
IMPRESSION: No evidence of urolithiasis, hydronephrosis, or other acute
findings.

Aortic Atherosclerosis ([id number]).

## 2024-01-13 ENCOUNTER — Encounter (HOSPITAL_COMMUNITY): Payer: Self-pay | Admitting: General Surgery
